# Patient Record
Sex: Female | Born: 1985
Health system: Southern US, Community
[De-identification: ages and names within clinical notes are randomized; demographics above are authoritative.]

## PROBLEM LIST (undated history)

## (undated) DIAGNOSIS — A549 Gonococcal infection, unspecified: Secondary | ICD-10-CM

## (undated) DIAGNOSIS — T7840XA Allergy, unspecified, initial encounter: Secondary | ICD-10-CM

## (undated) HISTORY — PX: APPENDECTOMY: SHX54

## (undated) HISTORY — PX: OTHER SURGICAL HISTORY: SHX169

## (undated) HISTORY — DX: Gonococcal infection, unspecified: A54.9

## (undated) HISTORY — DX: Allergy, unspecified, initial encounter: T78.40XA

## (undated) HISTORY — PX: WISDOM TOOTH EXTRACTION: SHX21

---

## 2008-08-20 ENCOUNTER — Encounter (INDEPENDENT_AMBULATORY_CARE_PROVIDER_SITE_OTHER): Payer: Self-pay | Admitting: General Surgery

## 2008-08-20 ENCOUNTER — Inpatient Hospital Stay (HOSPITAL_COMMUNITY): Admission: EM | Admit: 2008-08-20 | Discharge: 2008-08-21 | Payer: Self-pay | Admitting: Emergency Medicine

## 2009-08-08 IMAGING — CT CT ABD-PELV W/O CM
2 of 5 series · 14 of 42 positions shown, 19 images · non-contrast
Comparison: NONE

CLINICAL DATA: Right lower quadrant pain.  Evaluate for 
appendicitis.  

CT ABDOMEN AND PELVIS WITHOUT INTRAVENOUS OR ORAL CONTRAST
TECHNIQUE: Multiple axial images were obtained from the 
diaphragm through the pelvis.

[Series 2: wo · axial · 0.68mm/px · z∈[+1112,+1490]mm · 11 of 146 slices shown, 16 images]
[im 10/146  soft-tissue]
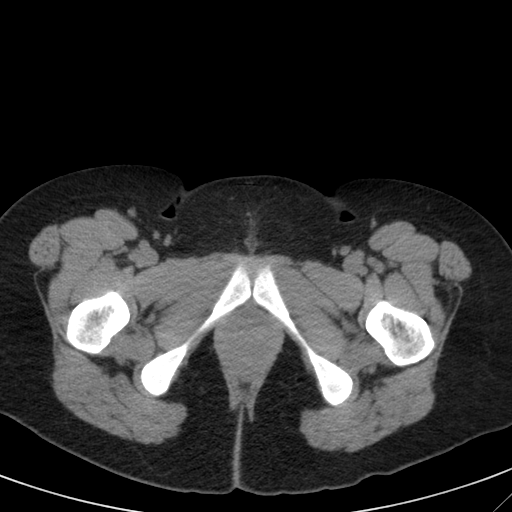
[im 10/146  bone]
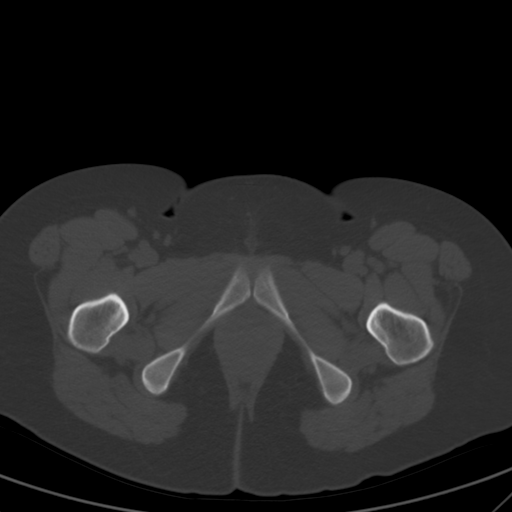
[im 28/146  soft-tissue]
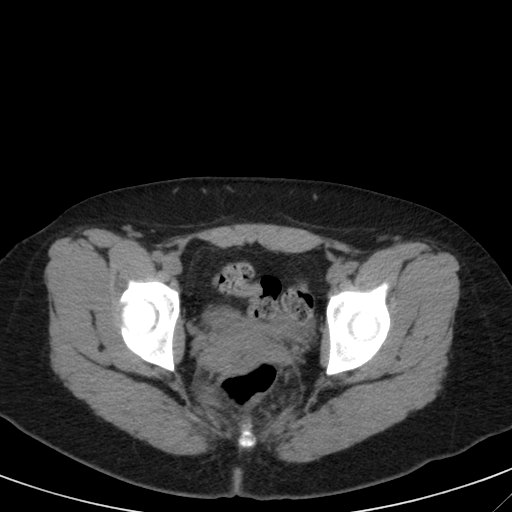
[im 37/146  soft-tissue]
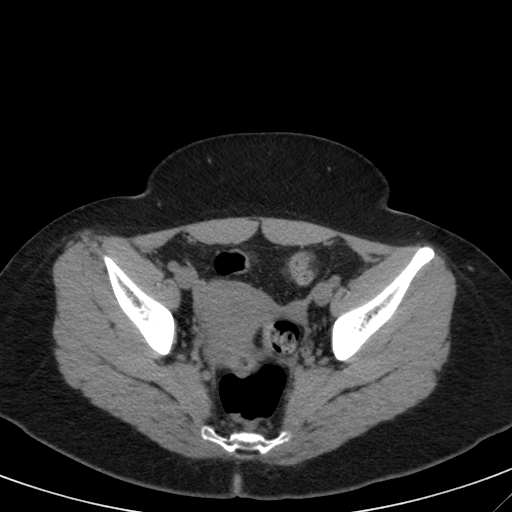
[im 55/146  soft-tissue]
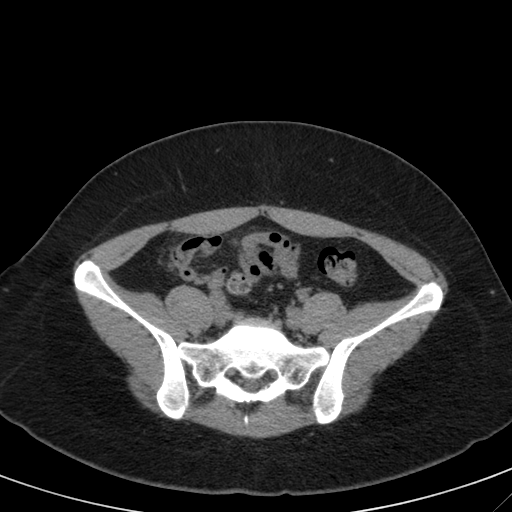
[im 64/146  soft-tissue]
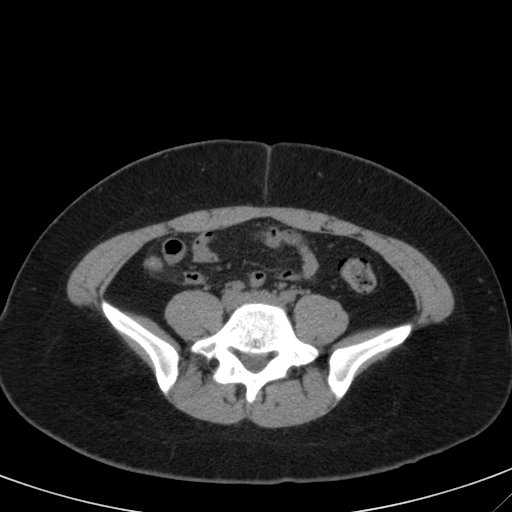
[im 82/146  soft-tissue]
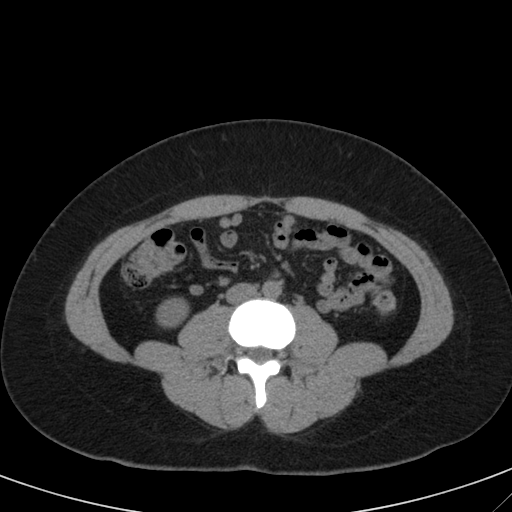
[im 91/146  soft-tissue]
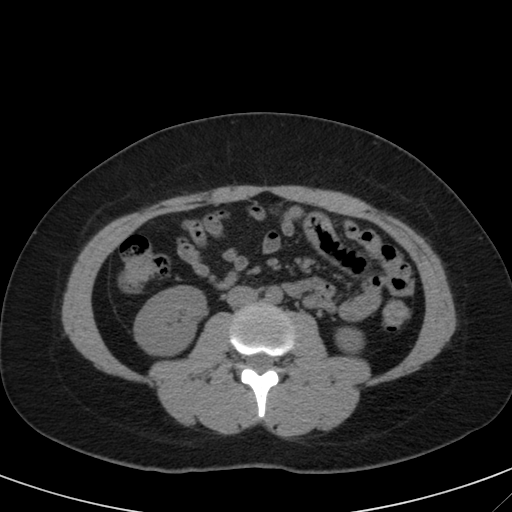
[im 109/146  soft-tissue]
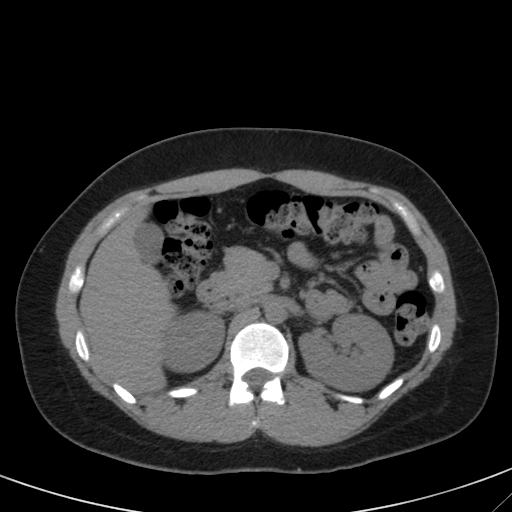
[im 109/146  lung]
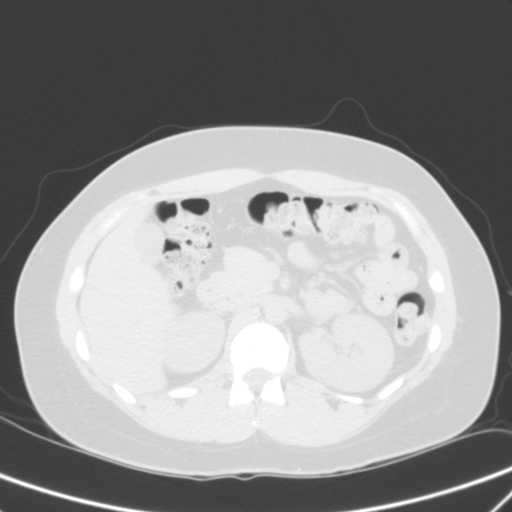
[im 118/146  soft-tissue]
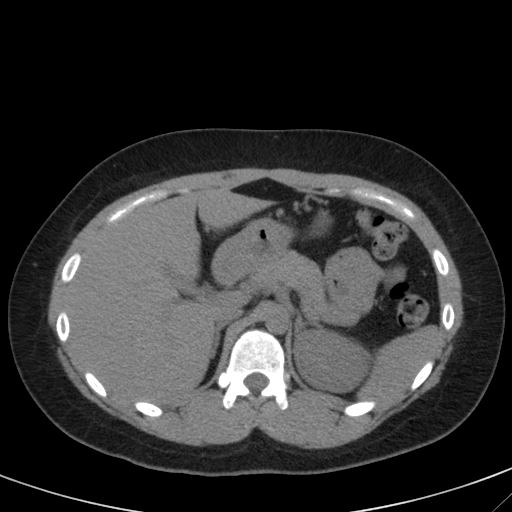
[im 118/146  lung]
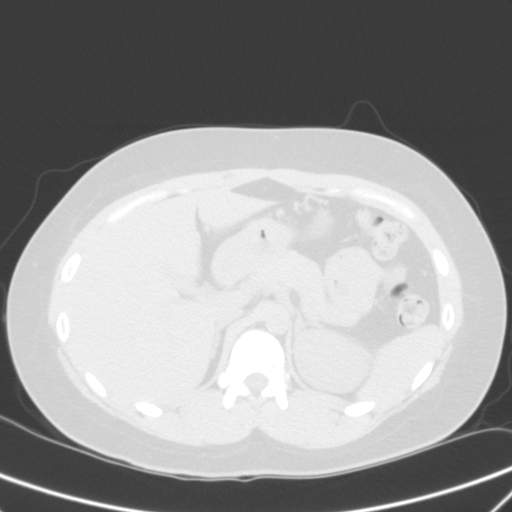
[im 118/146  bone]
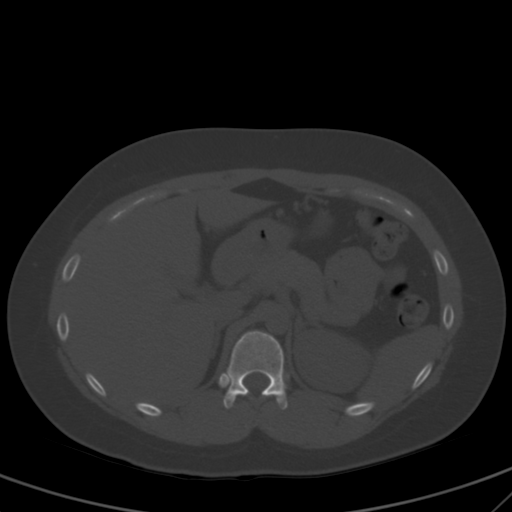
[im 127/146  lung]
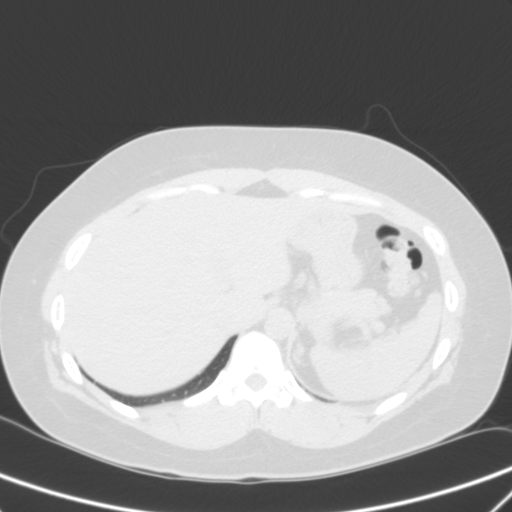
[im 136/146  soft-tissue]
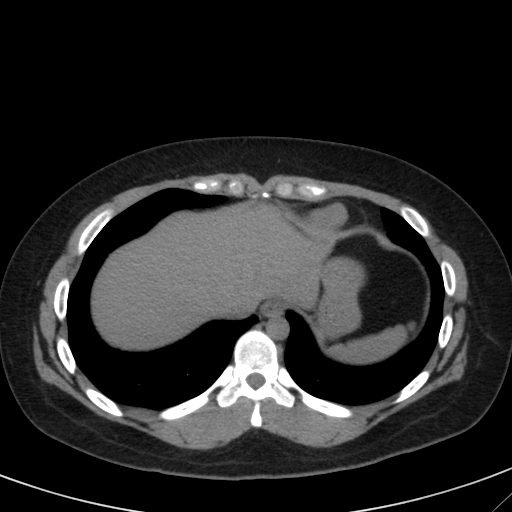
[im 136/146  lung]
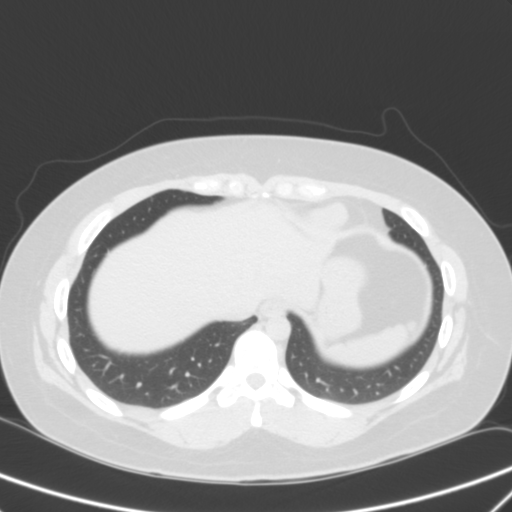

[coronals · coronal · 0.84mm/px · 3 of 62 slices shown]
[im 21/62  soft-tissue]
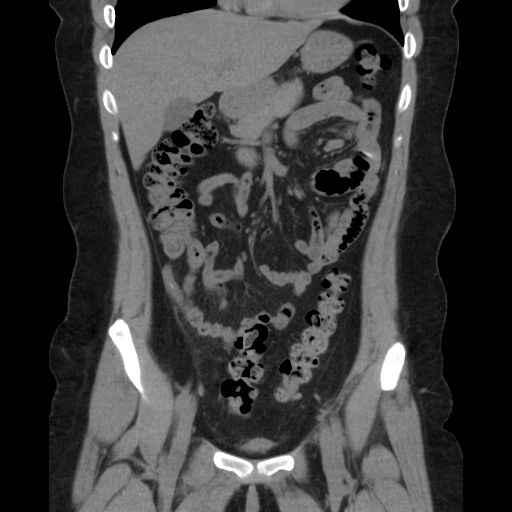
[im 28/62  soft-tissue]
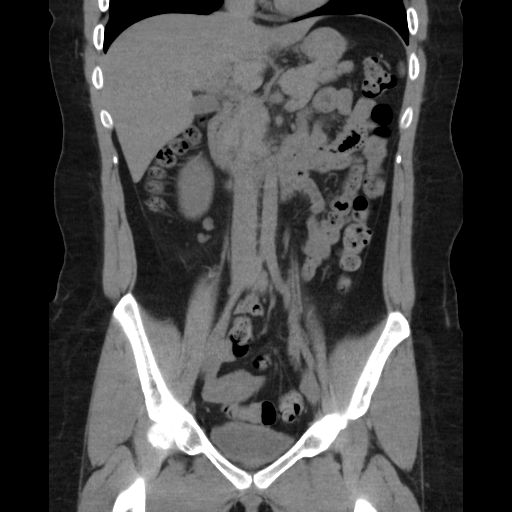
[im 34/62  soft-tissue]
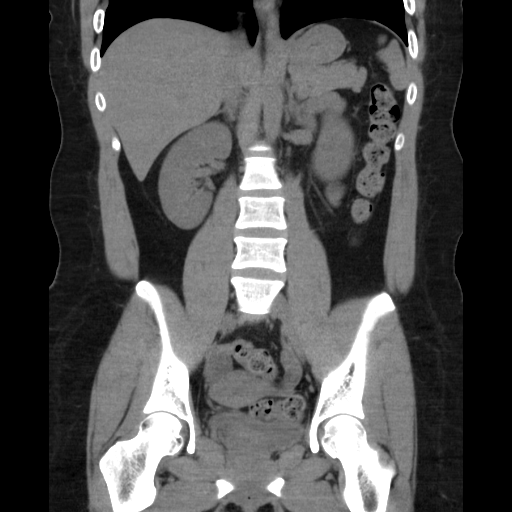

[14 of 42 positions shown; findings below may reference images not displayed]

FINDINGS: No gallstones, renal or ureteral calculi. Liver, 
pancreas, and spleen are unremarkable.  No evidence of abdominal 
aortic aneurysm. There is a tiny appendicolith in the mid portion 
of the appendix, which is dilated and inflamed.  The appendix 
measures approximately 1.2 cm in maximum diameter.  There is 
extensive periappendiceal stranding.  No free air or rupture. No 
evidence of diverticulitis, hernia, or bowel obstruction. No lung 
base mass, infiltrate, edema, or effusion. No lytic or blastic 
lesions.
IMPRESSION: Findings consistent with appendicitis. Rasmya Bsher 
08/20/2008  Tran Date:  08/20/2008 DAS  JLM

## 2010-08-29 ENCOUNTER — Emergency Department (HOSPITAL_COMMUNITY)
Admission: EM | Admit: 2010-08-29 | Discharge: 2010-08-29 | Payer: Self-pay | Source: Home / Self Care | Admitting: Emergency Medicine

## 2011-01-18 NOTE — H&P (Signed)
Tracy Cervantes, Tracy Cervantes                ACCOUNT NO.:  [id number]   MEDICAL RECORD NO.:  [id number]          PATIENT TYPE:  EMS   LOCATION:  ED                           FACILITY:  [facility]   PHYSICIAN:  Juanetta Gosling, MDDATE OF BIRTH:  01/05/1986   DATE OF ADMISSION:  08/20/2008  DATE OF DISCHARGE:                              HISTORY & PHYSICAL   CHIEF COMPLAINT:  Right lower quadrant pain.   HISTORY OF PRESENT ILLNESS:  A 25 year old female with right lower  quadrant pain since Monday that has not gone away.  It has slowly been  worsening.  She denies fevers, nausea, vomiting.  She is hungry  currently.  Has no vaginal discharge.  Last menstrual period was on  November 17 that was normal.  She has no urinary symptoms either.   PAST SURGICAL HISTORY:  Negative.   PAST MEDICAL HISTORY:  Negative.   SOCIAL HISTORY:  Occasional alcohol.  Is a nonsmoker.   ALLERGIES:  No known drug allergies.   MEDICATIONS:  No medications.   REVIEW OF SYSTEMS:  Otherwise negative.   PHYSICAL EXAMINATION:  VITAL SIGNS:  97.9, 60, 108/74, 16.  GENERAL:  She is a moderately obese female in no distress.  NECK:  Supple without adenopathy.  HEART:  Regular rate and rhythm.  LUNGS:  Clear bilaterally.  ABDOMEN:  Moderately obese.  It is soft.  There is mild tenderness in  her right lower quadrant to palpation.   Laboratory evaluation shows a white blood cell count of 8.9, hemoglobin  13.6, platelets 269, urinalysis essentially negative.  Her hCG is  negative.  She has a CT that appears to be consistent with acute  appendicitis.   IMPRESSION:  Acute appendicitis.   PLAN:  Admission, IV antibiotics, n.p.o. and appendectomy.      Juanetta Gosling, MD  Electronically Signed     MCW/MEDQ  D:  08/20/2008  T:  08/20/2008  Job:  [id number]   cc:   Pearline Cables, MD  Fax: [phone]

## 2011-01-18 NOTE — Op Note (Signed)
NAMEJUNIPER, Tracy Cervantes                ACCOUNT NO.:  [id number]   MEDICAL RECORD NO.:  [id number]          PATIENT TYPE:  INP   LOCATION:  1336                         [facility]ACILITY[facility]   PHYSICIAN:  Juanetta Gosling, MDDATE OF BIRTH:  Mar 30, 1986   DATE OF PROCEDURE:  08/20/2008  DATE OF DISCHARGE:                               OPERATIVE REPORT   POSTOPERATIVE DIAGNOSIS:  Acute appendicitis.   POSTOPERATIVE DIAGNOSIS:  Acute suppurative appendicitis.   PROCEDURE:  Laparoscopic appendectomy.   ASSISTANT:  None.   ANESTHESIA:  General.   Specimen appendix to Pathology.   ESTIMATED BLOOD LOSS:  Minimal.   DRAINS:  None.   COMPLICATIONS:  None.   DISPOSITION:  To recovery room in stable condition.   HISTORY:  [name] is a 25 year old female with approximately a 48-hour  history of right lower quadrant pain that has been worsening over that  time.  She was seen today and evaluated, and underwent a CT scan.  This  appeared to be consistent with acute appendicitis.  On exam, she has a  moderate tenderness in her right lower quadrant.  I counseled her for  laparoscopic appendectomy.   PROCEDURE:  After informed consent was obtained, the patient was  administered 1 g of Invanz, she was then taken to the operating room  where she had sequential compression devices placed on her lower  extremities.  She was then placed under general endotracheal anesthesia  without complication.  Her abdomen was then prepped and draped in a  standard, sterile, surgical fashion.  A Foley catheter was placed with  ease.  A surgical time-out was then performed.   A 10-mm vertical incision was then made below her umbilicus.  This was  carried out down to the level of her fascia, which was entered sharply.  Her peritoneum was then entered bluntly.  A 0 Vicryl pursestring suture  was then placed around the fascia.  Hasson trocar was then inserted, and  the abdomen was insufflated to 15 mmHg, which she  tolerated well.  A  further 5-mm port was placed in the suprapubic region and another was  placed in the right upper quadrant after infiltration with local  anesthetic under direct vision without complication.  The graspers were  then inserted, and the appendix was identified and noted to be acutely  suppurative.  The mesentery was thick around this as well.  The appendix  was raised, the base was identified, a [place] was used to  dissect the appendiceal mesentery.  This was then come across with a GIA  stapler at the base of the appendix without complication.  This was  hemostatic following this.  A vascular load in the stapler was then used  to come across the mesentery.  The appendix was then placed in an  EndoCatch and removed from the umbilical incision.  The abdomen was then  reinsufflated, irrigation was performed, this was clear.  All the staple  lines were noted to be hemostatic.  The 10-mm trocar was then removed.  This suture was tied down with no evidence of any  further defect.  The  abdomen was then desufflated.  All trocars were removed.  The skin was  closed with 4 Monocryl in a subcuticular fashion.  Dermabond were placed  over the wounds.  Her Foley catheter was removed.   She was extubated in the operating room, and transferred to the recovery  room in stable condition.      Juanetta Gosling, MD  Electronically Signed     MCW/MEDQ  D:  08/20/2008  T:  08/20/2008  Job:  [id number]   cc:   Pearline Cables, MD  Fax: [phone]
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
## 2011-12-30 ENCOUNTER — Ambulatory Visit (INDEPENDENT_AMBULATORY_CARE_PROVIDER_SITE_OTHER): Payer: 59 | Admitting: Family Medicine

## 2011-12-30 ENCOUNTER — Encounter: Payer: Self-pay | Admitting: Family Medicine

## 2011-12-30 VITALS — BP 111/72 | HR 83 | Temp 97.2°F | Resp 16 | Ht 62.5 in | Wt 143.8 lb

## 2011-12-30 DIAGNOSIS — B373 Candidiasis of vulva and vagina: Secondary | ICD-10-CM

## 2011-12-30 DIAGNOSIS — B3731 Acute candidiasis of vulva and vagina: Secondary | ICD-10-CM

## 2011-12-30 DIAGNOSIS — R87810 Cervical high risk human papillomavirus (HPV) DNA test positive: Secondary | ICD-10-CM

## 2011-12-30 DIAGNOSIS — Z01419 Encounter for gynecological examination (general) (routine) without abnormal findings: Secondary | ICD-10-CM

## 2011-12-30 DIAGNOSIS — Z0000 Encounter for general adult medical examination without abnormal findings: Secondary | ICD-10-CM

## 2011-12-30 DIAGNOSIS — E663 Overweight: Secondary | ICD-10-CM

## 2011-12-30 LAB — POCT WET PREP WITH KOH: Yeast Wet Prep HPF POC: NEGATIVE

## 2011-12-30 MED ORDER — FLUCONAZOLE 150 MG PO TABS: 150.0000 mg | ORAL_TABLET | Freq: Once | ORAL | Status: AC

## 2011-12-30 NOTE — Patient Instructions (Signed)
HPV Test The HPV (Human papillomavirus) test isused to screen for high-risk types with HPV infection. HPV is a group of about 100 related viruses, of which 40 types are genital viruses. Most HPV viruses cause infections that usually resolve without treatment within 2 years. Some HPV infections can cause skin and genital warts (condylomata). HPV types 16, 18, 31 and 45 are considered high-risk types of HPV. High-risk types of HPV do not usually cause visible warts, but if untreated, may lead to cancers of the outlet of the womb (cervix) or anus. An HPV test identifies the DNA (genetic) strands of the HPV infection. Because the test identifies the DNA strands, the test is also referred to as the HPV DNA test. Although HPV is found in both males and females, the HPV test is only used to screen for cervical cancer in females. This test is recommended for females:  With an abnormal Pap test.   After treatment of an abnormal Pap test.   Aged 27 and older.   After treatment of a high-risk HPV infection.  The HPV test may be done at the same time as a Pap test in females over the age of 27. Both the HPV and Pap test require a sample of cells from the cervix. PREPARATION FOR TEST  You may be asked to avoid douching, tampons or birth canal (vaginal) medicines for 48 hours before the HPV test. You will be asked to urinate before the test. For the HPV test, you will need to lie on an exam table with your feet in stirrups. A spatula will be inserted into the vagina. The spatula will be used to swab the cervix for a cell and mucus sample. The sample will be further evaluated in a lab under a microscope. NORMAL FINDINGS  Normal: High-risk HPV is not found.  Ranges for normal findings may vary among different laboratories and hospitals. You should always check with your doctor after having lab work or other tests done to discuss the meaning of your test results and whether your values are considered within normal  limits. MEANING OF TEST An abnormal HPV test means that high-risk HPV is found. Your caregiver may recommend further testing. Your caregiver will go over the test results with you and discuss the importance and meaning of your results, as well as treatment options and the need for additional tests, if necessary. OBTAINING THE RESULTS  It is your responsibility to obtain your test results. Ask the lab or department performing the test when and how you will get your results. Document Released: 09/16/2004 Document Revised: 08/11/2011 Document Reviewed: 06/01/2005 Exit[employer] Patient Information 2012 [employer]     .Keeping You Healthy  Get These Tests 1. Blood Pressure- Have your blood pressure checked once a year by your health care provider.  Normal blood pressure is 120/80. 2. Weight- Have your body mass index (BMI) calculated to screen for obesity.  BMI is measure of body fat based on height and weight.  You can also calculate your own BMI at www.nhlbisupport.com/bmi/. 3. Cholesterol- Have your cholesterol checked every 5 years starting at age 27 then yearly starting at age 27. 4. Chlamydia, HIV, and other sexually transmitted diseases- Get screened every year until age 27, then within three months of each new sexual provider. 5. Pap Smear- Every 1-3 years; discuss with your health care provider. 6. Mammogram- Every year starting at age 27  Take these medicines  Calcium with Vitamin D-Your body needs 1200 mg of Calcium each   day and 800-1000 IU of Vitamin D daily.  Your body can only absorb 500 mg of Calcium at a time so Calcium must be taken in 2 or 3 divided doses throughout the day.  Multivitamin with folic acid- Once daily if it is possible for you to become pregnant.  Get these Immunizations  Gardasil-Series of three doses; prevents HPV related illness such as genital warts and cervical cancer.  Menactra-Single dose; prevents meningitis.  Tetanus shot- Every 10 years.  Flu  shot-Every year.  Take these steps 1. Do not smoke-Your healthcare provider can help you quit.  For tips on how to quit go to www.smokefree.gov or call 1-800 QUITNOW. 2. Be physically active- Exercise 5 days a week for at least 30 minutes.  If you are not already physically active, start slow and gradually work up to 30 minutes of moderate physical activity.  Examples of moderate activity include walking briskly, dancing, swimming, bicycling, etc. 3. Breast Cancer- A self breast exam every month is important for early detection of breast cancer.  For more information and instruction on self breast exams, ask your healthcare provider or www.womenshealth.gov/faq/breast-self-exam.cfm. 4. Eat a healthy diet- Eat a variety of healthy foods such as fruits, vegetables, whole grains, low fat milk, low fat cheeses, yogurt, lean meats, poultry and fish, beans, nuts, tofu, etc.  For more information go to www. Thenutritionsource.org 5. Drink alcohol in moderation- Limit alcohol intake to one drink or less per day. Never drink and drive. 6. Depression- Your emotional health is as important as your physical health.  If you're feeling down or losing interest in things you normally enjoy please talk to your healthcare provider about being screened for depression. 7. Dental visit- Brush and floss your teeth twice daily; visit your dentist twice a year. 8. Eye doctor- Get an eye exam at least every 2 years. 9. Helmet use- Always wear a helmet when riding a bicycle, motorcycle, rollerblading or skateboarding. 10. Safe sex- If you may be exposed to sexually transmitted infections, use a condom. 11. Seat belts- Seat belts can save your live; always wear one. 12. Smoke/Carbon Monoxide detectors- These detectors need to be installed on the appropriate level of your home. Replace batteries at least once a year. 13. Skin cancer- When out in the sun please cover up and use sunscreen 15 SPF or higher. 14. Violence- If anyone  is threatening or hurting you, please tell your healthcare provider.        

## 2012-01-01 ENCOUNTER — Encounter: Payer: Self-pay | Admitting: Family Medicine

## 2012-01-01 DIAGNOSIS — E663 Overweight: Secondary | ICD-10-CM | POA: Insufficient documentation

## 2012-01-01 DIAGNOSIS — Z0000 Encounter for general adult medical examination without abnormal findings: Secondary | ICD-10-CM | POA: Insufficient documentation

## 2012-01-01 DIAGNOSIS — R87810 Cervical high risk human papillomavirus (HPV) DNA test positive: Secondary | ICD-10-CM | POA: Insufficient documentation

## 2012-01-01 NOTE — Progress Notes (Signed)
  Subjective:    Patient ID: Tracy Cervantes, female    DOB: 01-27-1986, 26 y.o.   MRN: [id number]  HPI This 26 y.o. Healthy female is here for CPE/PAP. She has no chronic medical issues except being overweight  and takes no chronic meds or supplements. She is single with no children, is a nonsmoker and rarely consumes  alcohol.Menses ar e irreg, lasting 5 days; monogamous, no birthcontrol. She exercises regularly (running 5 days/week).  She works as a Ambulance person.   PAP: 2011 (abnormal- Colposcopy with Biopsy showed no significant abnormalities)   See scanned records/media for Immunization record.    Review of Systems  Constitutional: Negative.   HENT: Negative.   Eyes: Negative.   Respiratory: Negative.   Cardiovascular: Negative.   Gastrointestinal: Negative.   Genitourinary: Negative.   Musculoskeletal: Negative.   Skin: Negative.   Neurological: Negative.   Hematological: Negative.   Psychiatric/Behavioral: Negative.        Objective:   Physical Exam  Nursing note and vitals reviewed. Constitutional: She is oriented to person, place, and time. She appears well-developed and well-nourished. No distress.  HENT:  Head: Normocephalic and atraumatic.  Right Ear: External ear normal.  Left Ear: External ear normal.  Nose: Nose normal.  Mouth/Throat: Oropharynx is clear and moist.  Eyes: Conjunctivae and EOM are normal. Pupils are equal, round, and reactive to light. No scleral icterus.  Neck: Normal range of motion. Neck supple. No thyromegaly present.  Cardiovascular: Normal rate, regular rhythm, normal heart sounds and intact distal pulses.  Exam reveals no gallop.   No murmur heard. Pulmonary/Chest: Effort normal and breath sounds normal. No respiratory distress. Right breast exhibits no inverted nipple, no mass, no nipple discharge and no tenderness. Left breast exhibits no inverted nipple, no mass, no nipple discharge and no tenderness.  Abdominal: Soft. Bowel sounds  are normal. There is no tenderness. There is no guarding.  Genitourinary: Rectum normal and uterus normal. Rectal exam shows no external hemorrhoid. There is no rash, tenderness or lesion on the right labia. There is no rash, tenderness or lesion on the left labia. Cervix exhibits discharge. Cervix exhibits no motion tenderness and no friability. Right adnexum displays no mass, no tenderness and no fullness. Left adnexum displays no mass, no tenderness and no fullness. No erythema, tenderness or bleeding around the vagina. Vaginal discharge found.  Musculoskeletal: Normal range of motion. She exhibits no edema and no tenderness.  Lymphadenopathy:    She has no cervical adenopathy.       Right: No inguinal adenopathy present.       Left: No inguinal adenopathy present.  Neurological: She is alert and oriented to person, place, and time. She has normal reflexes. No cranial nerve deficit. Coordination normal.  Skin: Skin is warm and dry. No pallor.  Psychiatric: She has a normal mood and affect. Her behavior is normal. Judgment and thought content normal.          Assessment & Plan:   1. Routine gynecological examination  POCT Wet Prep with KOH, Pap IG, CT/NG NAA, and HPV (high risk)  2. Routine general medical examination at a health care facility    3. Overweight (BMI 25.0-29.9)  Advised small changes in diet and continue regular exercise for 10-15 lbs. Wt loss  4. Candidal vulvovaginitis  RX: Diflucan 150 mg   1 tab as directed

## 2012-01-02 ENCOUNTER — Telehealth: Payer: Self-pay

## 2012-01-02 NOTE — Telephone Encounter (Signed)
PT HAD LAB WORK DONE AND WOULD LIKE TO KNOW RESULTS PLEASE CALL [phone]

## 2012-01-02 NOTE — Telephone Encounter (Signed)
Spoke with patient and let her know that labs are not back yet and we will call as soon as they get back.

## 2012-01-04 LAB — PAP IG, CT-NG NAA, HPV HIGH-RISK

## 2012-01-04 MED ORDER — AZITHROMYCIN 1 G PO PACK: 1.0000 | PACK | Freq: Once | ORAL | Status: AC

## 2012-01-04 NOTE — Progress Notes (Signed)
Quick Note:  Please call pt and advise that the following labs are abnormal...  The Chlamydia test done at the time of your PAP is positive: Medication routed to pharmacy- Zithromax 1 gram packet (add water as directed and take this 1x dose).  Schedule RTC in 1 month to test for clearance of infection. ______

## 2012-01-04 NOTE — Progress Notes (Signed)
Addended by: Dow Adolph B on: 01/04/2012 11:55 AM   Modules accepted: Orders

## 2012-01-04 NOTE — Progress Notes (Signed)
Quick Note:  Notify pt of Normal results. ______ 

## 2012-02-16 ENCOUNTER — Telehealth: Payer: Self-pay

## 2012-02-16 ENCOUNTER — Ambulatory Visit (INDEPENDENT_AMBULATORY_CARE_PROVIDER_SITE_OTHER): Payer: 59 | Admitting: Family Medicine

## 2012-02-16 ENCOUNTER — Encounter: Payer: Self-pay | Admitting: Family Medicine

## 2012-02-16 VITALS — BP 122/82 | HR 63 | Temp 97.6°F | Resp 16 | Ht 62.0 in | Wt 183.2 lb

## 2012-02-16 DIAGNOSIS — IMO0001 Reserved for inherently not codable concepts without codable children: Secondary | ICD-10-CM

## 2012-02-16 DIAGNOSIS — Z113 Encounter for screening for infections with a predominantly sexual mode of transmission: Secondary | ICD-10-CM

## 2012-02-16 DIAGNOSIS — Z309 Encounter for contraceptive management, unspecified: Secondary | ICD-10-CM

## 2012-02-16 DIAGNOSIS — N926 Irregular menstruation, unspecified: Secondary | ICD-10-CM

## 2012-02-16 MED ORDER — NORGESTIM-ETH ESTRAD TRIPHASIC 0.18/0.215/0.25 MG-25 MCG PO TABS: 1.0000 | ORAL_TABLET | Freq: Every day | ORAL | Status: DC

## 2012-02-16 NOTE — Telephone Encounter (Signed)
Dr Audria Nine  Pt said that the rx you wrote for bcp is not covered by her ins.  She would like to use a generic and could [facility] send it [facility]  thanks

## 2012-02-16 NOTE — Patient Instructions (Signed)
Contraception Choices Contraception (birth control) is the use of any methods or devices to prevent pregnancy. Below are some methods to help avoid pregnancy. HORMONAL METHODS   Contraceptive implant. This is a thin, plastic tube containing progesterone hormone. It does not contain estrogen hormone. Your caregiver inserts the tube in the inner part of the upper arm. The tube can remain in place for up to 3 years. After 3 years, the implant must be removed. The implant prevents the ovaries from releasing an egg (ovulation), thickens the cervical mucus which prevents sperm from entering the uterus, and thins the lining of the inside of the uterus.   Progesterone-only injections. These injections are given every 3 months by your caregiver to prevent pregnancy. This synthetic progesterone hormone stops the ovaries from releasing eggs. It also thickens cervical mucus and changes the uterine lining. This makes it harder for sperm to survive in the uterus.   Birth control pills. These pills contain estrogen and progesterone hormone. They work by stopping the egg from forming in the ovary (ovulation). Birth control pills are prescribed by a caregiver.Birth control pills can also be used to treat heavy periods.   Minipill. This type of birth control pill contains only the progesterone hormone. They are taken every day of each month and must be prescribed by your caregiver.   Birth control patch. The patch contains hormones similar to those in birth control pills. It must be changed once a week and is prescribed by a caregiver.   Vaginal ring. The ring contains hormones similar to those in birth control pills. It is left in the vagina for 3 weeks, removed for 1 week, and then a new one is put back in place. The patient must be comfortable inserting and removing the ring from the vagina.A caregiver's prescription is necessary.   Emergency contraception. Emergency contraceptives prevent pregnancy after  unprotected sexual intercourse. This pill can be taken right after sex or up to 5 days after unprotected sex. It is most effective the sooner you take the pills after having sexual intercourse. Emergency contraceptive pills are available without a prescription. Check with your pharmacist. Do not use emergency contraception as your only form of birth control.  BARRIER METHODS   Female condom. This is a thin sheath (latex or rubber) that is worn over the penis during sexual intercourse. It can be used with spermicide to increase effectiveness.   Female condom. This is a soft, loose-fitting sheath that is put into the vagina before sexual intercourse.   Diaphragm. This is a soft, latex, dome-shaped barrier that must be fitted by a caregiver. It is inserted into the vagina, along with a spermicidal jelly. It is inserted before intercourse. The diaphragm should be left in the vagina for 6 to 8 hours after intercourse.   Cervical cap. This is a round, soft, latex or plastic cup that fits over the cervix and must be fitted by a caregiver. The cap can be left in place for up to 48 hours after intercourse.   Sponge. This is a soft, circular piece of polyurethane foam. The sponge has spermicide in it. It is inserted into the vagina after wetting it and before sexual intercourse.   Spermicides. These are chemicals that kill or block sperm from entering the cervix and uterus. They come in the form of creams, jellies, suppositories, foam, or tablets. They do not require a prescription. They are inserted into the vagina with an applicator before having sexual intercourse. The process must be   repeated every time you have sexual intercourse.  INTRAUTERINE CONTRACEPTION  Intrauterine device (IUD). This is a T-shaped device that is put in a woman's uterus during a menstrual period to prevent pregnancy. There are 2 types:   Copper IUD. This type of IUD is wrapped in copper wire and is placed inside the uterus. Copper  makes the uterus and fallopian tubes produce a fluid that kills sperm. It can stay in place for 10 years.   Hormone IUD. This type of IUD contains the hormone progestin (synthetic progesterone). The hormone thickens the cervical mucus and prevents sperm from entering the uterus, and it also thins the uterine lining to prevent implantation of a fertilized egg. The hormone can weaken or kill the sperm that get into the uterus. It can stay in place for 5 years.  PERMANENT METHODS OF CONTRACEPTION  Female tubal ligation. This is when the woman's fallopian tubes are surgically sealed, tied, or blocked to prevent the egg from traveling to the uterus.   Female sterilization. This is when the female has the tubes that carry sperm tied off (vasectomy).This blocks sperm from entering the vagina during sexual intercourse. After the procedure, the man can still ejaculate fluid (semen).  NATURAL PLANNING METHODS  Natural family planning. This is not having sexual intercourse or using a barrier method (condom, diaphragm, cervical cap) on days the woman could become pregnant.   Calendar method. This is keeping track of the length of each menstrual cycle and identifying when you are fertile.   Ovulation method. This is avoiding sexual intercourse during ovulation.   Symptothermal method. This is avoiding sexual intercourse during ovulation, using a thermometer and ovulation symptoms.   Post-ovulation method. This is timing sexual intercourse after you have ovulated.  Regardless of which type or method of contraception you choose, it is important that you use condoms to protect against the transmission of sexually transmitted diseases (STDs). Talk with your caregiver about which form of contraception is most appropriate for you. Document Released: 08/22/2005 Document Revised: 08/11/2011 Document Reviewed: 12/29/2010 [employer] Patient Information 2012 [employer]. 

## 2012-02-16 NOTE — Progress Notes (Signed)
  Subjective:    Patient ID: Tracy Cervantes, female    DOB: [birth year], 26 y.o.   MRN: [id number]  HPI Pt returns for "test of cure" after completing treatment for Chlamydia cervicitis. She has no pelvic pain or  discharge. Had protected sex since last visit. Desires contraception-OCPs. Took OCPs several years ago  without problems. No hx of HA disorder or clotting problems. Is a nonsmoker. Menstrual hx: always irregular.  LNMP: 01/10/12.     Review of Systems As per HPI     Objective:   Physical Exam  Vitals reviewed. Constitutional: She is oriented to person, place, and time. She appears well-developed and well-nourished. No distress.  HENT:  Head: Normocephalic and atraumatic.  Right Ear: External ear normal.  Left Ear: External ear normal.  Eyes: Conjunctivae and EOM are normal. No scleral icterus.  Neck: Neck supple. No thyromegaly present.  Cardiovascular: Normal rate.   Pulmonary/Chest: Effort normal. No respiratory distress.  Abdominal: Soft. She exhibits no mass. There is no tenderness. There is no guarding.       No organomegaly  Musculoskeletal: Normal range of motion. She exhibits no edema and no tenderness.  Neurological: She is alert and oriented to person, place, and time. No cranial nerve deficit.  Skin: Skin is warm and dry. No rash noted.  Psychiatric: She has a normal mood and affect. Her behavior is normal.          Assessment & Plan:   1. Screening for STD (sexually transmitted disease)  Chlamydia probe amplification, urine  2. Contraception  Norgestimate-Ethinyl Estradiol Triphasic (ORTHO TRI-CYCLEN LO) 0.18/0.215/0.25 MG-25 MCG tablet, DISCONTINUED: Norgestimate-Ethinyl Estradiol Triphasic (ORTHO TRI-CYCLEN LO) 0.18/0.215/0.25 MG-25 MCG tablet  3. Irregular menses  Norgestimate-Ethinyl Estradiol Triphasic (ORTHO TRI-CYCLEN LO) 0.18/0.215/0.25 MG-25 MCG tablet, DISCONTINUED: Norgestimate-Ethinyl Estradiol Triphasic (ORTHO TRI-CYCLEN LO) 0.18/0.215/0.25 MG-25  MCG tablet   RTC in 6 months for Contraception follow-up; return sooner if problems develop

## 2012-02-16 NOTE — Telephone Encounter (Signed)
10:39 - phone message routed to Dr Audria Nine concerning bcp not covered by patient's ins.  I called the [facility] pharmacy, spoke to [place] (pharmacist) I advised her of situation and she gave me name of bcp (Tri-Cyclen) w/o the Lo. The RX will be free for this brand, I also told the doctor.  I called and advised patient, per Dr Audria Nine

## 2012-02-17 LAB — CHLAMYDIA PROBE AMPLIFICATION, URINE: Chlamydia, Swab/Urine, PCR: NEGATIVE

## 2012-02-17 NOTE — Telephone Encounter (Signed)
This was discussed with Ms. Alona Bene, CMA and I authorized OCP change to free contraceptive pill.

## 2012-02-19 NOTE — Progress Notes (Signed)
Quick Note:  Please notify pt that results are normal.   Provide pt with copy of labs. ______ 

## 2012-03-02 ENCOUNTER — Telehealth: Payer: Self-pay

## 2012-03-02 NOTE — Telephone Encounter (Signed)
PT WOULD LIKE TO COME BY AND PICK UP A COPY OF HER MEDICAL RECORDS. WAS TOLD IT COULD TAKE UP TO 3-4 DAYS PLEASE CALL [phone] IF NEEDED

## 2012-03-02 NOTE — Telephone Encounter (Signed)
Records printed/copied and ready for pickup. Patient notified.

## 2012-04-06 ENCOUNTER — Encounter: Payer: Self-pay | Admitting: Family Medicine

## 2012-05-05 ENCOUNTER — Ambulatory Visit (INDEPENDENT_AMBULATORY_CARE_PROVIDER_SITE_OTHER): Payer: 59 | Admitting: Family Medicine

## 2012-05-05 VITALS — BP 133/87 | HR 71 | Temp 98.4°F | Resp 16 | Ht 62.0 in | Wt 180.4 lb

## 2012-05-05 DIAGNOSIS — R5383 Other fatigue: Secondary | ICD-10-CM

## 2012-05-05 DIAGNOSIS — R42 Dizziness and giddiness: Secondary | ICD-10-CM

## 2012-05-05 DIAGNOSIS — R5381 Other malaise: Secondary | ICD-10-CM

## 2012-05-05 LAB — POCT URINALYSIS DIPSTICK
Bilirubin, UA: NEGATIVE
Blood, UA: NEGATIVE
Glucose, UA: NEGATIVE
Leukocytes, UA: NEGATIVE
Nitrite, UA: NEGATIVE
Protein, UA: NEGATIVE
Spec Grav, UA: >=1.03
Urobilinogen, UA: 0.2
pH, UA: 6

## 2012-05-05 LAB — POCT CBC
Granulocyte percent: 71.2 % (ref 37–80)
HCT, POC: 45.3 % (ref 37.7–47.9)
Hemoglobin: 13.8 g/dL (ref 12.2–16.2)
Lymph, poc: 2.6 (ref 0.6–3.4)
MCH, POC: 28.3 pg (ref 27–31.2)
MCHC: 30.5 g/dL — AB (ref 31.8–35.4)
MCV: 92.8 fL (ref 80–97)
MID (cbc): 0.5 (ref 0–0.9)
MPV: 10.1 fL (ref 0–99.8)
POC Granulocyte: 7.7 — AB (ref 2–6.9)
POC LYMPH PERCENT: 24.3 % (ref 10–50)
POC MID %: 4.5 % (ref 0–12)
Platelet Count, POC: 268 K/uL (ref 142–424)
RBC: 4.88 M/uL (ref 4.04–5.48)
RDW, POC: 12.8 %
WBC: 10.8 K/uL — AB (ref 4.6–10.2)

## 2012-05-05 LAB — POCT UA - MICROSCOPIC ONLY
Casts, Ur, LPF, POC: NEGATIVE
Crystals, Ur, HPF, POC: NEGATIVE
Mucus, UA: POSITIVE
Yeast, UA: NEGATIVE

## 2012-05-05 LAB — POCT SEDIMENTATION RATE: POCT SED RATE: 44 mm/hr — AB (ref 0–22)

## 2012-05-05 NOTE — Progress Notes (Signed)
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  She has had two weeks of progressive weakness and lightheadedness, with dizziness worse today. On birth control for past month, LMP August 27th,  She is on a lower dose from her Ob-Gyn.  (With her first OCP she had a bad headache)  F/H: neg for DM  Sig Negs:  Trauma, headache, nausea or vomiting.    Obj:  Overweight young adult in NAD, appropriate with good eye contact HEENT:  Unremarkable Chest:  Clear Heart:  Regular without murmur Abdomen: soft, nontender without HSM Skin:  No rashes seen  Assessment:

## 2012-05-06 LAB — COMPREHENSIVE METABOLIC PANEL
ALT: 34 U/L (ref 0–35)
AST: 23 U/L (ref 0–37)
Albumin: 4.6 g/dL (ref 3.5–5.2)
Alkaline Phosphatase: 54 U/L (ref 39–117)
BUN: 15 mg/dL (ref 6–23)
CO2: 28 mEq/L (ref 19–32)
Calcium: 9.5 mg/dL (ref 8.4–10.5)
Chloride: 102 mEq/L (ref 96–112)
Creat: 0.73 mg/dL (ref 0.50–1.10)
Glucose, Bld: 84 mg/dL (ref 70–99)
Potassium: 3.9 mEq/L (ref 3.5–5.3)
Sodium: 139 mEq/L (ref 135–145)
Total Bilirubin: 0.3 mg/dL (ref 0.3–1.2)
Total Protein: 7.3 g/dL (ref 6.0–8.3)

## 2012-05-08 LAB — EPSTEIN-BARR VIRUS VCA ANTIBODY PANEL
EBV EA IgG: <5 U/mL (ref <9.0)
EBV NA IgG: 292 U/mL — ABNORMAL HIGH (ref <18.0)
EBV VCA IgG: 626 U/mL — ABNORMAL HIGH (ref <18.0)
EBV VCA IgM: <10 U/mL (ref <36.0)

## 2012-07-09 ENCOUNTER — Ambulatory Visit (INDEPENDENT_AMBULATORY_CARE_PROVIDER_SITE_OTHER): Payer: 59 | Admitting: Family Medicine

## 2012-07-09 ENCOUNTER — Encounter: Payer: Self-pay | Admitting: *Deleted

## 2012-07-09 VITALS — BP 134/90 | HR 88 | Temp 98.5°F | Resp 16 | Ht 61.0 in | Wt 178.4 lb

## 2012-07-09 DIAGNOSIS — H6690 Otitis media, unspecified, unspecified ear: Secondary | ICD-10-CM

## 2012-07-09 MED ORDER — AMOXICILLIN-POT CLAVULANATE 875-125 MG PO TABS: 1.0000 | ORAL_TABLET | Freq: Two times a day (BID) | ORAL | Status: DC

## 2012-07-09 NOTE — Patient Instructions (Addendum)
You have an ear infection.  Use the antibiotic pill as directed and OTC medications for pain as needed.  If you are not better in the next couple of days let me know.   Otitis Media You or your child has otitis media. This is an infection of the middle chamber of the ear. This condition is common in young children and often follows upper respiratory infections. Symptoms of otitis media may include earache or ear fullness, hearing loss, or fever. If the eardrum ruptures, a middle ear infection may also cause bloody or pus-like discharge from the ear. Fussiness, irritability, and persistent crying may be the only signs of otitis media in small children. Otitis media can be caused by a bacteria or a virus. Antibiotics may be used to treat bacterial otitis media. But antibiotics are not effective against viral infections. Not every case of bacterial otitis media requires antibiotics and depending on age, severity of infection, and other risk factors, observation may be all that is required. Ear drops or oral medicines may be prescribed to reduce pain, fever, or congestion. Babies with ear infections should not be fed while lying on their backs. This increases the pressure and pain in the ear. Do not put cotton in the ear canal or clean it with cotton swabs. Swimming should be avoided if the eardrum has ruptured or if there is drainage from the ear canal. If your child experiences recurrent infections, your child may need to be referred to an Ear, Nose, and Throat specialist. HOME CARE INSTRUCTIONS   Take any antibiotic as directed by your caregiver. You or your child may feel better in a few days, but take all medicine or the infection may not respond and may become more difficult to treat.   Only take over-the-counter or prescription medicines for pain, discomfort, or fever as directed by your caregiver. Do not give aspirin to children.  Otitis media can lead to complications including rupture of the eardrum,  long-term hearing loss, and more severe infections. Call your caregiver for follow-up care at the end of treatment. SEEK IMMEDIATE MEDICAL CARE IF:   Your or your child's problems do not improve within 2 to 3 days.   You or your child has an oral temperature above 102 F (38.9 C), not controlled by medicine.   Your baby is older than 3 months with a rectal temperature of 102 F (38.9 C) or higher.   Your baby is 97 months old or younger with a rectal temperature of 100.4 F (38 C) or higher.   Your child develops increased fussiness.   You or your child develops a stiff neck, severe headache, or confusion.   There is swelling around the ear.   There is dizziness, vomiting, unusual sleepiness, seizures, or twitching of facial muscles.   The pain or ear drainage persists beyond 2 days of antibiotic treatment.  Document Released: 09/29/2004 Document Revised: 08/11/2011 Document Reviewed: 12/18/2009 [facility] Patient Information 2012 [place], [place].

## 2012-07-09 NOTE — Progress Notes (Signed)
[facility] [address] [place] [address] [phone]  Date:  07/09/2012   Name:  Tracy Cervantes   DOB:  October 05, 1985   MRN:  [id number]  PCP:  No primary provider on file.    Chief Complaint: Otalgia   History of Present Illness:  Tracy Cervantes is a 26 y.o. very pleasant female patient who presents with the following:  She is here with left ear pain that started around 12:30 today- seen around 3:30 pm.  Her right ear feels ok.  She also notes a ST, cough for the last day or so but her ear is the main problem. She does not feel achy or fatigued.  She has not noted a fever.  LMP was 06/26/12 She is otherwise generally healthy.  No tobacco    Patient Active Problem List  Diagnosis  . Health care maintenance  . Overweight (BMI 25.0-29.9)  . Cervical high risk HPV (human papillomavirus) test positive    No past medical history on file.  No past surgical history on file.  History  Substance Use Topics  . Smoking status: Never Smoker   . Smokeless tobacco: Not on file  . Alcohol Use: Not on file    No family history on file.  No Known Allergies  Medication list has been reviewed and updated.  Current Outpatient Prescriptions on File Prior to Visit  Medication Sig Dispense Refill  . Norgestimate-Ethinyl Estradiol Triphasic (ORTHO TRI-CYCLEN LO) 0.18/0.215/0.25 MG-25 MCG tablet Take 1 tablet by mouth daily.  1 Package  11    Review of Systems:  As per HPI- otherwise negative.   Physical Examination: Filed Vitals:   07/09/12 1523  BP: 134/90  Pulse: 88  Temp: 98.5 F (36.9 C)  Resp: 16   Filed Vitals:   07/09/12 1523  Height: 5\' 1"  (1.549 m)  Weight: 178 lb 6.4 oz (80.922 kg)   Body mass index is 33.71 kg/(m^2). Ideal Body Weight: Weight in (lb) to have BMI = 25: 132   GEN: WDWN, NAD, Non-toxic, A & O x 3, overweight HEENT: Atraumatic, Normocephalic. Neck supple. No masses, No LAD.  Left TM is red, bulging with acute OM.  Left  TM wnl, oropharynx normal.  PEERL,EOMI.   Ears and Nose: No external deformity. CV: RRR, No M/G/R. No JVD. No thrill. No extra heart sounds. PULM: CTA B, no wheezes, crackles, rhonchi. No retractions. No resp. distress. No accessory muscle use. EXTR: No c/c/e NEURO Normal gait.  PSYCH: Normally interactive. Conversant. Not depressed or anxious appearing.  Calm demeanor.    Assessment and Plan: 1. Otitis media  amoxicillin-clavulanate (AUGMENTIN) 875-125 MG per tablet   Will treat with augmentin for AOM.  See pt instructions for further details.  Went over signs and symptoms of TM rupture- in this case please come in for a recheck.   Meds ordered this encounter  Medications  . amoxicillin-clavulanate (AUGMENTIN) 875-125 MG per tablet    Sig: Take 1 tablet by mouth 2 (two) times daily.    Dispense:  20 tablet    Refill:  0     COPLAND,JESSICA, MD

## 2012-10-24 ENCOUNTER — Ambulatory Visit (INDEPENDENT_AMBULATORY_CARE_PROVIDER_SITE_OTHER): Payer: 59 | Admitting: Family Medicine

## 2012-10-24 ENCOUNTER — Telehealth: Payer: Self-pay | Admitting: Family Medicine

## 2012-10-24 VITALS — BP 131/89 | HR 71 | Temp 98.1°F | Resp 16 | Ht 63.0 in | Wt 180.0 lb

## 2012-10-24 DIAGNOSIS — N926 Irregular menstruation, unspecified: Secondary | ICD-10-CM

## 2012-10-24 DIAGNOSIS — R5381 Other malaise: Secondary | ICD-10-CM

## 2012-10-24 DIAGNOSIS — Z113 Encounter for screening for infections with a predominantly sexual mode of transmission: Secondary | ICD-10-CM

## 2012-10-24 DIAGNOSIS — Z309 Encounter for contraceptive management, unspecified: Secondary | ICD-10-CM

## 2012-10-24 DIAGNOSIS — IMO0001 Reserved for inherently not codable concepts without codable children: Secondary | ICD-10-CM

## 2012-10-24 DIAGNOSIS — B9689 Other specified bacterial agents as the cause of diseases classified elsewhere: Secondary | ICD-10-CM

## 2012-10-24 DIAGNOSIS — Z0000 Encounter for general adult medical examination without abnormal findings: Secondary | ICD-10-CM

## 2012-10-24 DIAGNOSIS — R5383 Other fatigue: Secondary | ICD-10-CM

## 2012-10-24 DIAGNOSIS — N898 Other specified noninflammatory disorders of vagina: Secondary | ICD-10-CM

## 2012-10-24 LAB — POCT CBC
Granulocyte percent: 67.3 % (ref 37–80)
HCT, POC: 45.9 % (ref 37.7–47.9)
Hemoglobin: 14.5 g/dL (ref 12.2–16.2)
Lymph, poc: 2.5 (ref 0.6–3.4)
MCH, POC: 28.7 pg (ref 27–31.2)
MCHC: 31.6 g/dL — AB (ref 31.8–35.4)
MCV: 90.8 fL (ref 80–97)
MID (cbc): 0.5 (ref 0–0.9)
MPV: 10.1 fL (ref 0–99.8)
POC Granulocyte: 6.1 (ref 2–6.9)
POC LYMPH PERCENT: 27.3 % (ref 10–50)
POC MID %: 5.4 %M (ref 0–12)
Platelet Count, POC: 271 10*3/uL (ref 142–424)
RBC: 5.06 M/uL (ref 4.04–5.48)
RDW, POC: 12.8 %
WBC: 9.1 10*3/uL (ref 4.6–10.2)

## 2012-10-24 LAB — COMPREHENSIVE METABOLIC PANEL
ALT: 30 U/L (ref 0–35)
BUN: 15 mg/dL (ref 6–23)
Creat: 0.68 mg/dL (ref 0.50–1.10)
Glucose, Bld: 81 mg/dL (ref 70–99)
Sodium: 138 mEq/L (ref 135–145)
Total Bilirubin: 0.4 mg/dL (ref 0.3–1.2)

## 2012-10-24 LAB — TSH: TSH: 1.069 u[IU]/mL (ref 0.350–4.500)

## 2012-10-24 LAB — LIPID PANEL
Cholesterol: 251 mg/dL — ABNORMAL HIGH (ref 0–200)
HDL: 62 mg/dL (ref >39)
LDL Cholesterol: 160 mg/dL — ABNORMAL HIGH (ref 0–99)
Total CHOL/HDL Ratio: 4 ratio
Triglycerides: 146 mg/dL (ref <150)
VLDL: 29 mg/dL (ref 0–40)

## 2012-10-24 LAB — HIV ANTIBODY (ROUTINE TESTING W REFLEX): HIV: NONREACTIVE

## 2012-10-24 LAB — COMPREHENSIVE METABOLIC PANEL WITH GFR
AST: 24 U/L (ref 0–37)
Albumin: 4.2 g/dL (ref 3.5–5.2)
Alkaline Phosphatase: 51 U/L (ref 39–117)
CO2: 25 meq/L (ref 19–32)
Calcium: 9.4 mg/dL (ref 8.4–10.5)
Chloride: 104 meq/L (ref 96–112)
Potassium: 4.1 meq/L (ref 3.5–5.3)
Total Protein: 7.6 g/dL (ref 6.0–8.3)

## 2012-10-24 LAB — HEPATITIS B SURFACE ANTIGEN: Hepatitis B Surface Ag: NEGATIVE

## 2012-10-24 LAB — POCT WET PREP WITH KOH
Clue Cells Wet Prep HPF POC: 100
KOH Prep POC: NEGATIVE
Trichomonas, UA: NEGATIVE
Yeast Wet Prep HPF POC: NEGATIVE

## 2012-10-24 LAB — POCT URINE PREGNANCY: Preg Test, Ur: NEGATIVE

## 2012-10-24 LAB — HEPATITIS C ANTIBODY: HCV Ab: NEGATIVE

## 2012-10-24 MED ORDER — DESOGESTREL-ETHINYL ESTRADIOL 0.15-0.02/0.01 MG (21/5) PO TABS: 1.0000 | ORAL_TABLET | Freq: Every day | ORAL | Status: DC

## 2012-10-24 MED ORDER — METRONIDAZOLE 500 MG PO TABS: 500.0000 mg | ORAL_TABLET | Freq: Two times a day (BID) | ORAL | Status: DC

## 2012-10-24 NOTE — Telephone Encounter (Signed)
LM for her to call us back. She left before wet prep was completed. Please let her know she has BActerial Vaginosis. I will rx her Flagyl 500 mg BID x 7 [facility]

## 2012-10-24 NOTE — Progress Notes (Signed)
[facility]:  Office Visit  Chief Complaint:  Chief Complaint  Patient presents with  . Annual Exam    not sure if she wants a pap     HPI: Tracy Cervantes is a 27 y.o. female who complains of here for annual exam. She has no complaints except some mild fatigue. Hair is falling  out "when she washes it, has clumsp of hair". Skin is nl. Not depressed. No weight gain or weight loss.  Menarche 27 y/o, regular since started OCP, irregular prior to this, nl -light flow with OCP.Started OCP when she was 62.  Prior h/o abnormal Pap, s/p biopsy which was negative in 2010. Subsequent paps have been normal.  Sexually  Active, heterosexual, uses condoms each time LMP 10/14/12  Past Medical History  Diagnosis Date  . Allergy    Past Surgical History  Procedure Laterality Date  . Appendectomy     History   Social History  . Marital Status: Single    Spouse Name: N/A    Number of Children: N/A  . Years of Education: N/A   Social History Main Topics  . Smoking status: Never Smoker   . Smokeless tobacco: None  . Alcohol Use: Yes  . Drug Use: No  . Sexually Active: Yes    Birth Control/ Protection: Pill   Other Topics Concern  . None   Social History Narrative  . None   History reviewed. No pertinent family history. No Known Allergies Prior to Admission medications   Medication Sig Start Date End Date Taking? Authorizing Provider  Norgestimate-Ethinyl Estradiol Triphasic (ORTHO TRI-CYCLEN LO) 0.18/0.215/0.25 MG-25 MCG tablet Take 1 tablet by mouth daily. 02/16/12 02/15/13 Yes Maurice March, MD  amoxicillin-clavulanate (AUGMENTIN) 875-125 MG per tablet Take 1 tablet by mouth 2 (two) times daily. 07/09/12   Gwenlyn Found Copland, MD     ROS: The patient denies fevers, chills, night sweats, unintentional weight loss, chest pain, palpitations, wheezing, dyspnea on exertion, nausea, vomiting, abdominal pain, dysuria, hematuria, melena, numbness, weakness, or tingling.    All other systems have been reviewed and were otherwise negative with the exception of those mentioned in the HPI and as above.    PHYSICAL EXAM: Filed Vitals:   10/24/12 1150  BP: 131/89  Pulse: 71  Temp: 98.1 F (36.7 C)  Resp: 16   Filed Vitals:   10/24/12 1150  Height: 5\' 3"  (1.6 m)  Weight: 180 lb (81.647 kg)   Body mass index is 31.89 kg/(m^2).  General: Alert, no acute distress HEENT:  Normocephalic, atraumatic, oropharynx patent. EOMI, PERRLA, fundoscopic exam normal. Cardiovascular:  Regular rate and rhythm, no rubs murmurs or gallops.  No Carotid bruits, radial pulse intact. No pedal edema.  Respiratory: Clear to auscultation bilaterally.  No wheezes, rales, or rhonchi.  No cyanosis, no use of accessory musculature GI: No organomegaly, abdomen is soft and non-tender, positive bowel sounds.  No masses. Skin: No rashes. Neurologic: Facial musculature symmetric. Psychiatric: Patient is appropriate throughout our interaction. Lymphatic: No cervical lymphadenopathy Musculoskeletal: Gait intact. Breast exam normal GU exam-+ white dc, odor; No masses, lesions, CMT    LABS: Results for orders placed in visit on 10/24/12  POCT CBC      Result Value Range   WBC 9.1  4.6 - 10.2 K/uL   Lymph, poc 2.5  0.6 - 3.4   POC LYMPH PERCENT 27.3  10 - 50 %L   MID (cbc) 0.5  0 - 0.9   POC MID %  5.4  0 - 12 %M   POC Granulocyte 6.1  2 - 6.9   Granulocyte percent 67.3  37 - 80 %G   RBC 5.06  4.04 - 5.48 M/uL   Hemoglobin 14.5  12.2 - 16.2 g/dL   HCT, POC 16.1  09.6 - 47.9 %   MCV 90.8  80 - 97 fL   MCH, POC 28.7  27 - 31.2 pg   MCHC 31.6 (*) 31.8 - 35.4 g/dL   RDW, POC 04.5     Platelet Count, POC 271  142 - 424 K/uL   MPV 10.1  0 - 99.8 fL  POCT URINE PREGNANCY      Result Value Range   Preg Test, Ur Negative    POCT WET PREP WITH KOH      Result Value Range   Trichomonas, UA Negative     Clue Cells Wet Prep HPF POC 100%     Epithelial Wet Prep HPF POC 6-15     Yeast  Wet Prep HPF POC neg     Bacteria Wet Prep HPF POC 4+     RBC Wet Prep HPF POC 1-2     WBC Wet Prep HPF POC 5-tntc     KOH Prep POC Negative       EKG/XRAY:   Primary read interpreted by Dr. Conley Rolls at [facility].   ASSESSMENT/PLAN: Encounter Diagnoses  Name Primary?  . Annual physical exam Yes  . Screening for STD (sexually transmitted disease)   . Other malaise and fatigue   . Vaginal discharge   . Contraception   . Irregular menses    Annual labs and STD tests pending Rx Falgyl for Bacterial Vaginosis OCP refilled F/u prn   Lorrine Killilea PHUONG, DO 10/24/2012 6:00 PM

## 2012-10-24 NOTE — Progress Notes (Signed)
  Subjective:    Patient ID: Tracy Cervantes, female    DOB: 18-Sep-1985, 27 y.o.   MRN: [id number]  HPI    Review of Systems  Constitutional: Positive for fatigue.       Objective:   Physical Exam        Assessment & Plan:

## 2012-10-25 LAB — HSV(HERPES SIMPLEX VRS) I + II AB-IGG
HSV 1 Glycoprotein G Ab, IgG: 7.42 IV — ABNORMAL HIGH
HSV 2 Glycoprotein G Ab, IgG: 0.18 IV

## 2012-10-25 LAB — RPR

## 2012-10-25 LAB — PAP IG, CT-NG, RFX HPV ASCU
Chlamydia Probe Amp: NEGATIVE
GC Probe Amp: NEGATIVE

## 2012-10-25 LAB — HEPATITIS B SURFACE ANTIBODY, QUANTITATIVE: Hep B S AB Quant (Post): 518.7 m[IU]/mL

## 2012-11-08 ENCOUNTER — Encounter: Payer: Self-pay | Admitting: Family Medicine

## 2013-01-04 ENCOUNTER — Ambulatory Visit (INDEPENDENT_AMBULATORY_CARE_PROVIDER_SITE_OTHER): Payer: 59 | Admitting: Family Medicine

## 2013-01-04 ENCOUNTER — Encounter: Payer: Self-pay | Admitting: Family Medicine

## 2013-01-04 VITALS — BP 127/86 | HR 84 | Temp 98.7°F | Resp 16 | Ht 62.5 in | Wt 181.4 lb

## 2013-01-04 DIAGNOSIS — N760 Acute vaginitis: Secondary | ICD-10-CM

## 2013-01-04 DIAGNOSIS — N898 Other specified noninflammatory disorders of vagina: Secondary | ICD-10-CM

## 2013-01-04 DIAGNOSIS — A499 Bacterial infection, unspecified: Secondary | ICD-10-CM

## 2013-01-04 DIAGNOSIS — B9689 Other specified bacterial agents as the cause of diseases classified elsewhere: Secondary | ICD-10-CM

## 2013-01-04 DIAGNOSIS — B373 Candidiasis of vulva and vagina: Secondary | ICD-10-CM

## 2013-01-04 DIAGNOSIS — Z3009 Encounter for other general counseling and advice on contraception: Secondary | ICD-10-CM

## 2013-01-04 LAB — POCT WET PREP WITH KOH
KOH Prep POC: POSITIVE
Trichomonas, UA: NEGATIVE
Yeast Wet Prep HPF POC: NEGATIVE

## 2013-01-04 MED ORDER — NORELGESTROMIN-ETH ESTRADIOL 150-35 MCG/24HR TD PTWK: 1.0000 | MEDICATED_PATCH | TRANSDERMAL | Status: DC

## 2013-01-04 MED ORDER — METRONIDAZOLE 500 MG PO TABS: 500.0000 mg | ORAL_TABLET | Freq: Two times a day (BID) | ORAL | Status: DC

## 2013-01-04 MED ORDER — FLUCONAZOLE 150 MG PO TABS: 150.0000 mg | ORAL_TABLET | Freq: Once | ORAL | Status: DC

## 2013-01-04 NOTE — Patient Instructions (Signed)
Consider starting to take vaginal probiotics (you take them orally). You can find them at [facility] or [facility] likely.  Also, try switching the type of condoms you are using to non-latex.  Only wash externally with Dove soap and make sure your partner does the same.  Bacterial Vaginosis Bacterial vaginosis (BV) is a vaginal infection where the normal balance of bacteria in the vagina is disrupted. The normal balance is then replaced by an overgrowth of certain bacteria. There are several different kinds of bacteria that can cause BV. BV is the most common vaginal infection in women of childbearing age. CAUSES   The cause of BV is not fully understood. BV develops when there is an increase or imbalance of harmful bacteria.  Some activities or behaviors can upset the normal balance of bacteria in the vagina and put women at increased risk including:  Having a new sex partner or multiple sex partners.  Douching.  Using an intrauterine device (IUD) for contraception.  It is not clear what role sexual activity plays in the development of BV. However, women that have never had sexual intercourse are rarely infected with BV. Women do not get BV from toilet seats, bedding, swimming pools or from touching objects around them.  SYMPTOMS   Grey vaginal discharge.  A fish-like odor with discharge, especially after sexual intercourse.  Itching or burning of the vagina and vulva.  Burning or pain with urination.  Some women have no signs or symptoms at all. DIAGNOSIS  Your caregiver must examine the vagina for signs of BV. Your caregiver will perform lab tests and look at the sample of vaginal fluid through a microscope. They will look for bacteria and abnormal cells (clue cells), a pH test higher than 4.5, and a positive amine test all associated with BV.  RISKS AND COMPLICATIONS   Pelvic inflammatory disease (PID).  Infections following gynecology surgery.  Developing HIV.  Developing herpes  virus. TREATMENT  Sometimes BV will clear up without treatment. However, all women with symptoms of BV should be treated to avoid complications, especially if gynecology surgery is planned. Female partners generally do not need to be treated. However, BV may spread between female sex partners so treatment is helpful in preventing a recurrence of BV.   BV may be treated with antibiotics. The antibiotics come in either pill or vaginal cream forms. Either can be used with nonpregnant or pregnant women, but the recommended dosages differ. These antibiotics are not harmful to the baby.  BV can recur after treatment. If this happens, a second round of antibiotics will often be prescribed.  Treatment is important for pregnant women. If not treated, BV can cause a premature delivery, especially for a pregnant woman who had a premature birth in the past. All pregnant women who have symptoms of BV should be checked and treated.  For chronic reoccurrence of BV, treatment with a type of prescribed gel vaginally twice a week is helpful. HOME CARE INSTRUCTIONS   Finish all medication as directed by your caregiver.  Do not have sex until treatment is completed.  Tell your sexual partner that you have a vaginal infection. They should see their caregiver and be treated if they have problems, such as a mild rash or itching.  Practice safe sex. Use condoms. Only have 1 sex partner. PREVENTION  Basic prevention steps can help reduce the risk of upsetting the natural balance of bacteria in the vagina and developing BV:  Do not have sexual intercourse (be abstinent).  Do not douche.  Use all of the medicine prescribed for treatment of BV, even if the signs and symptoms go away.  Tell your sex partner if you have BV. That way, they can be treated, if needed, to prevent reoccurrence. SEEK MEDICAL CARE IF:   Your symptoms are not improving after 3 days of treatment.  You have increased discharge, pain, or  fever. MAKE SURE YOU:   Understand these instructions.  Will watch your condition.  Will get help right away if you are not doing well or get worse. FOR MORE INFORMATION  Division of STD Prevention (DSTDP), Centers for Disease Control and Prevention: [employer] American Social Health Association (ASHA): www.ashastd.org  Document Released: 08/22/2005 Document Revised: 11/14/2011 Document Reviewed: 02/12/2009 [facility] Patient Information 2013 [place], [place].

## 2013-01-04 NOTE — Progress Notes (Signed)
  Subjective:    Patient ID: Tracy Cervantes, female    DOB: [birth year], 27 y.o.   MRN: [id number] Chief Complaint  Patient presents with  . Vaginal Discharge    HPI  Feels like she has constant BV.  She has seen gyn in the past.  When she finishes the medicine, the sxs come back again.  Everytime she has sex, uses condoms. Current discharge for the past 2 wks, with odor, no itching.    Review of Systems    BP 127/86  Pulse 84  Temp(Src) 98.7 F (37.1 C) (Oral)  Resp 16  Ht 5' 2.5" (1.588 m)  Wt 181 lb 6.4 oz (82.283 kg)  BMI 32.63 kg/m2  SpO2 98%  LMP 12/16/2012 Objective:   Physical Exam        Results for orders placed in visit on 01/04/13  POCT WET PREP WITH KOH      Result Value Range   Trichomonas, UA Negative     Clue Cells Wet Prep HPF POC tntc     Epithelial Wet Prep HPF POC 6-tntc     Yeast Wet Prep HPF POC neg     Bacteria Wet Prep HPF POC 3+     RBC Wet Prep HPF POC 0-5     WBC Wet Prep HPF POC 0-6     KOH Prep POC Positive      Assessment & Plan:  Family planning - Plan: Ambulatory referral to Gynecology  Vaginal discharge - Plan: POCT Wet Prep with KOH  Bacterial vaginosis - Plan: metroNIDAZOLE (FLAGYL) 500 MG tablet  Vaginal yeast infection  Meds ordered this encounter  Medications  . norelgestromin-ethinyl estradiol (ORTHO EVRA) 150-20 MCG/24HR transdermal patch    Sig: Place 1 patch onto the skin once a week.    Dispense:  3 patch    Refill:  12  . metroNIDAZOLE (FLAGYL) 500 MG tablet    Sig: Take 1 tablet (500 mg total) by mouth 2 (two) times daily.    Dispense:  14 tablet    Refill:  0  . fluconazole (DIFLUCAN) 150 MG tablet    Sig: Take 1 tablet (150 mg total) by mouth once. Repeat if needed    Dispense:  2 tablet    Refill:  0

## 2013-01-17 ENCOUNTER — Ambulatory Visit (INDEPENDENT_AMBULATORY_CARE_PROVIDER_SITE_OTHER): Payer: 59 | Admitting: Women's Health

## 2013-01-17 ENCOUNTER — Encounter: Payer: Self-pay | Admitting: Women's Health

## 2013-01-17 VITALS — BP 130/78 | Ht 62.0 in | Wt 176.5 lb

## 2013-01-17 DIAGNOSIS — Z3009 Encounter for other general counseling and advice on contraception: Secondary | ICD-10-CM

## 2013-01-17 DIAGNOSIS — N898 Other specified noninflammatory disorders of vagina: Secondary | ICD-10-CM

## 2013-01-17 LAB — WET PREP FOR TRICH, YEAST, CLUE: Yeast Wet Prep HPF POC: NONE SEEN

## 2013-01-17 NOTE — Progress Notes (Signed)
Patient ID: Tracy Cervantes, female   DOB: Dec 12, 1985, 27 y.o.   MRN: [id number] New patient presents with complaints of chronic bacterial vaginosis and requests Mirena IUD. Regular monthly cycles lasting 2-3 days on Kariva. Currently menstruating. Plans to start Ortho Evra patch, prescribed by PC. Reports abnormal pap 2009-2010 with biopsy, + HR HPV, normal paps since. Last annual exam and pap April 2014. Reports gonorrhea 2008 and 2012, negative test of cure, negative STD screen with current partner.   Exam: appears well. Speculum exam: menstrual blood, No visible lesions, irritation, or erythema. Wet prep negative. Bimanual no CMT or adnexal fullness or tenderness.  Contraception counseling Overweight History-Positive  high risk HPV Recurrent bacteria vaginosis  Plan: Contraception options discussed and information given on Mirena. Slight risk for infection, perforation, hemorrhage reviewed. Will schedule placement with next period with Dr. Audie Box. Boric acid gel cap per vagina twice weekly to prevent bacterial vaginosis, prescription, proper use given and reviewed, instructed to call if no relief of symptoms. SBE's, calcium rich diet, daily multivitamin, healthy diet and exercise encouraged for weight loss and health.

## 2013-01-17 NOTE — Patient Instructions (Addendum)
Bacterial Vaginosis Bacterial vaginosis (BV) is a vaginal infection where the normal balance of bacteria in the vagina is disrupted. The normal balance is then replaced by an overgrowth of certain bacteria. There are several different kinds of bacteria that can cause BV. BV is the most common vaginal infection in women of childbearing age. CAUSES   The cause of BV is not fully understood. BV develops when there is an increase or imbalance of harmful bacteria.  Some activities or behaviors can upset the normal balance of bacteria in the vagina and put women at increased risk including:  Having a new sex partner or multiple sex partners.  Douching.  Using an intrauterine device (IUD) for contraception.  It is not clear what role sexual activity plays in the development of BV. However, women that have never had sexual intercourse are rarely infected with BV. Women do not get BV from toilet seats, bedding, swimming pools or from touching objects around them.  SYMPTOMS   Grey vaginal discharge.  A fish-like odor with discharge, especially after sexual intercourse.  Itching or burning of the vagina and vulva.  Burning or pain with urination.  Some women have no signs or symptoms at all. DIAGNOSIS  Your caregiver must examine the vagina for signs of BV. Your caregiver will perform lab tests and look at the sample of vaginal fluid through a microscope. They will look for bacteria and abnormal cells (clue cells), a pH test higher than 4.5, and a positive amine test all associated with BV.  RISKS AND COMPLICATIONS   Pelvic inflammatory disease (PID).  Infections following gynecology surgery.  Developing HIV.  Developing herpes virus. TREATMENT  Sometimes BV will clear up without treatment. However, all women with symptoms of BV should be treated to avoid complications, especially if gynecology surgery is planned. Female partners generally do not need to be treated. However, BV may spread  between female sex partners so treatment is helpful in preventing a recurrence of BV.   BV may be treated with antibiotics. The antibiotics come in either pill or vaginal cream forms. Either can be used with nonpregnant or pregnant women, but the recommended dosages differ. These antibiotics are not harmful to the baby.  BV can recur after treatment. If this happens, a second round of antibiotics will often be prescribed.  Treatment is important for pregnant women. If not treated, BV can cause a premature delivery, especially for a pregnant woman who had a premature birth in the past. All pregnant women who have symptoms of BV should be checked and treated.  For chronic reoccurrence of BV, treatment with a type of prescribed gel vaginally twice a week is helpful. HOME CARE INSTRUCTIONS   Finish all medication as directed by your caregiver.  Do not have sex until treatment is completed.  Tell your sexual partner that you have a vaginal infection. They should see their caregiver and be treated if they have problems, such as a mild rash or itching.  Practice safe sex. Use condoms. Only have 1 sex partner. PREVENTION  Basic prevention steps can help reduce the risk of upsetting the natural balance of bacteria in the vagina and developing BV:  Do not have sexual intercourse (be abstinent).  Do not douche.  Use all of the medicine prescribed for treatment of BV, even if the signs and symptoms go away.  Tell your sex partner if you have BV. That way, they can be treated, if needed, to prevent reoccurrence. SEEK MEDICAL CARE IF:     Your symptoms are not improving after 3 days of treatment.  You have increased discharge, pain, or fever. MAKE SURE YOU:   Understand these instructions.  Will watch your condition.  Will get help right away if you are not doing well or get worse. FOR MORE INFORMATION  Division of STD Prevention (DSTDP), Centers for Disease Control and Prevention:  www.cdc.gov/std American Social Health Association (ASHA): www.ashastd.org  Document Released: 08/22/2005 Document Revised: 11/14/2011 Document Reviewed: 02/12/2009 ExitCare Patient Information 2013 ExitCare[employer] LLC.  

## 2013-01-18 ENCOUNTER — Telehealth: Payer: Self-pay | Admitting: Gynecology

## 2013-01-18 NOTE — Telephone Encounter (Signed)
01/18/13-Pt was advised today that her [facility] insurance covers the Mirena IUD and insertion at 100%. She has appt with [facility] for insertion.WL

## 2013-01-22 ENCOUNTER — Other Ambulatory Visit: Payer: Self-pay | Admitting: Gynecology

## 2013-01-22 DIAGNOSIS — Z3049 Encounter for surveillance of other contraceptives: Secondary | ICD-10-CM

## 2013-01-22 MED ORDER — LEVONORGESTREL 20 MCG/24HR IU IUD: INTRAUTERINE_SYSTEM | Freq: Once | INTRAUTERINE | Status: AC

## 2013-02-11 ENCOUNTER — Ambulatory Visit: Payer: 59 | Admitting: Gynecology

## 2013-03-13 ENCOUNTER — Encounter: Payer: Self-pay | Admitting: Gynecology

## 2013-03-13 ENCOUNTER — Ambulatory Visit (INDEPENDENT_AMBULATORY_CARE_PROVIDER_SITE_OTHER): Payer: 59 | Admitting: Gynecology

## 2013-03-13 DIAGNOSIS — Z30430 Encounter for insertion of intrauterine contraceptive device: Secondary | ICD-10-CM

## 2013-03-13 NOTE — Patient Instructions (Signed)
Intrauterine Device Insertion Most often, an intrauterine device (IUD) is inserted into the uterus to prevent pregnancy. There are 2 types of IUDs available:  Copper IUD. This type of IUD creates an environment that is not favorable to sperm survival. The mechanism of action of the copper IUD is not known for certain. It can stay in place for 10 years.  Hormone IUD. This type of IUD contains the hormone progestin (synthetic progesterone). The progestin thickens the cervical mucus and prevents sperm from entering the uterus, and it also thins the uterine lining. There is no evidence that the hormone IUD prevents implantation. The hormone IUD can stay in place for up to 5 years. An IUD is the most cost-effective birth control if left in place for the full duration. It may be removed at any time. LET YOUR CAREGIVER KNOW ABOUT:  Sensitivity to metals.  Medicines taken including herbs, eyedrops, over-the-counter medicines, and creams.  Use of steroids (by mouth or creams).  Previous problems with anesthetics or numbing medicine.  Previous gynecological surgery.  History of blood clots or clotting disorders.  Possibility of pregnancy.  Menstrual irregularities.  Concerns regarding unusual vaginal discharge or odors.  Previous experience with an IUD.  Other health problems. RISKS AND COMPLICATIONS  Accidental puncture (perforation) of the uterus.  Accidental placement of the IUD either in the muscle layer of the uterus (myometrium) or outside the uterus. If this happen, the IUD can be found essentially floating around the bowels. When this happens, the IUD must be taken out surgically.  The IUD may fall out of the uterus (expulsion). This is more common in women who have recently had a child.   Pregnancy in the fallopian tube (ectopic). BEFORE THE PROCEDURE  Schedule the IUD insertion for when you will have your menstrual period or right after, to make sure you are not pregnant.  Placement of the IUD is better tolerated shortly after a menstrual cycle.  You may need to take tests or be examined to make sure you are not pregnant.  You may be required to take a pregnancy test.  You may be required to get checked for sexually transmitted infections (STIs) prior to placement. Placing an IUD in someone who has an infection can make an infection worse.  You may be given a pain reliever to take 1 or 2 hours before the procedure.  An exam will be performed to determine the size and position of your uterus.  Ask your caregiver about changing or stopping your regular medicines. PROCEDURE   A tool (speculum) is placed in the vagina. This allows your caregiver to see the lower part of the uterus (cervix).  The cervix is prepped with a medicine that lowers the risk of infection.  You may be given a medicine to numb each side of the cervix (intracervical or paracervical block). This is used to block and control any discomfort with insertion.  A tool (uterine sound) is inserted into the uterus to determine the length of the uterine cavity and the direction the uterus may be tilted.  A slim instrument (IUD inserter) is inserted through the cervical canal and into your uterus.  The IUD is placed in the uterine cavity and the insertion device is removed.  The nylon string that is attached to the IUD, and used for eventual IUD removal, is trimmed. It is trimmed so that it lays high in the vagina, just outside the cervix. AFTER THE PROCEDURE  You may have bleeding after the   procedure. This is normal. It varies from light spotting for a few days to menstrual-like bleeding.  You may have mild cramping.  Practice checking the string coming out of the cervix to make sure the IUD remains in the uterus. If you cannot feel the string, you should schedule a "string check" with your caregiver.  If you had a hormone IUD inserted, expect that your period may be lighter or nonexistent  within a year's time (though this is not always the case). There may be delayed fertility with the hormone IUD as a result of its progesterone effect. When you are ready to become pregnant, it is suggested to have the IUD removed up to 1 year in advance.  Yearly exams are advised. Document Released: 04/20/2011 Document Revised: 11/14/2011 Document Reviewed: 04/20/2011 ExitCare Patient Information 2014 ExitCare, LLC.  

## 2013-03-13 NOTE — Progress Notes (Signed)
Patient presents for Mirena IUD placement. She has read through the booklet, has no contraindications and signed the consent form. She currently is on a normal menses during her pill free week.  I reviewed the insertional process with her as well as the risks to include infection either immediate or long-term, uterine perforation or migration requiring surgery to remove, other complications such as pain, hormonal side effects and possibilities of failure with subsequent pregnancy.   Exam with [facility] assistant Pelvic: External BUS vagina normal. Cervix normal with menses type flow. Uterus anteverted normal size shape contour midline mobile nontender. Adnexa without masses or tenderness.  Procedure: The cervix was cleansed with Betadine, anterior lip grasped with a single-tooth tenaculum, the uterus was sounded and a Mirena IUD was placed according to manufacturer's recommendations without difficulty. The strings were trimmed. The patient tolerated well and will follow up in one month for a postinsertional check.  Lot number:  [id number]  Note: This document was prepared with digital dictation and possible smart phrase technology. Any transcriptional errors that result from this process are unintentional.

## 2013-04-15 ENCOUNTER — Ambulatory Visit (INDEPENDENT_AMBULATORY_CARE_PROVIDER_SITE_OTHER): Payer: 59 | Admitting: Gynecology

## 2013-04-15 ENCOUNTER — Encounter: Payer: Self-pay | Admitting: Gynecology

## 2013-04-15 DIAGNOSIS — Z30431 Encounter for routine checking of intrauterine contraceptive device: Secondary | ICD-10-CM

## 2013-04-15 NOTE — Patient Instructions (Signed)
Followup of any issues with your Mirena IUD. Otherwise continue to followup annually for your GYN checkups. IUD will need to be replaced/removed in 5 years.

## 2013-04-15 NOTE — Progress Notes (Signed)
Patient presents for IUD followup exam. Mirena IUD was placed 03/13/2013. She's done well since then.  Exam was [facility] assistant Pelvic external BUS vagina normal. Cervix normal with IUD string visualized. String was trimmed. Uterus anteverted normal size midline mobile nontender. Adnexa without masses or tenderness.  Assessment and plan: Normal IUD check. Doing well without menses. We'll continue to monitor. Normally has her routine GYN exam through her primary physician's office and will followup with them for this. Followup with [place] if any issues.

## 2014-01-31 ENCOUNTER — Ambulatory Visit (INDEPENDENT_AMBULATORY_CARE_PROVIDER_SITE_OTHER): Payer: 59 | Admitting: Women's Health

## 2014-01-31 ENCOUNTER — Encounter: Payer: Self-pay | Admitting: Women's Health

## 2014-01-31 VITALS — BP 128/86 | Ht 62.0 in | Wt 180.0 lb

## 2014-01-31 DIAGNOSIS — B9689 Other specified bacterial agents as the cause of diseases classified elsewhere: Secondary | ICD-10-CM

## 2014-01-31 DIAGNOSIS — N949 Unspecified condition associated with female genital organs and menstrual cycle: Secondary | ICD-10-CM

## 2014-01-31 DIAGNOSIS — B3731 Acute candidiasis of vulva and vagina: Secondary | ICD-10-CM

## 2014-01-31 DIAGNOSIS — Z113 Encounter for screening for infections with a predominantly sexual mode of transmission: Secondary | ICD-10-CM

## 2014-01-31 DIAGNOSIS — Z01419 Encounter for gynecological examination (general) (routine) without abnormal findings: Secondary | ICD-10-CM

## 2014-01-31 DIAGNOSIS — A499 Bacterial infection, unspecified: Secondary | ICD-10-CM

## 2014-01-31 DIAGNOSIS — B373 Candidiasis of vulva and vagina: Secondary | ICD-10-CM

## 2014-01-31 DIAGNOSIS — N898 Other specified noninflammatory disorders of vagina: Secondary | ICD-10-CM

## 2014-01-31 DIAGNOSIS — N760 Acute vaginitis: Secondary | ICD-10-CM

## 2014-01-31 LAB — CBC WITH DIFFERENTIAL/PLATELET
BASOS ABS: 0 10*3/uL (ref 0.0–0.1)
BASOS PCT: 0 % (ref 0–1)
EOS ABS: 0.3 10*3/uL (ref 0.0–0.7)
EOS PCT: 4 % (ref 0–5)
HEMATOCRIT: 41.5 % (ref 36.0–46.0)
HEMOGLOBIN: 14.1 g/dL (ref 12.0–15.0)
LYMPHS ABS: 2.1 10*3/uL (ref 0.7–4.0)
LYMPHS PCT: 27 % (ref 12–46)
MCH: 29.3 pg (ref 26.0–34.0)
MCHC: 34 g/dL (ref 30.0–36.0)
MCV: 86.1 fL (ref 78.0–100.0)
MONO ABS: 0.5 10*3/uL (ref 0.1–1.0)
MONOS PCT: 6 % (ref 3–12)
Neutro Abs: 5 10*3/uL (ref 1.7–7.7)
Neutrophils Relative %: 63 % (ref 43–77)
Platelets: 217 10*3/uL (ref 150–400)
RBC: 4.82 MIL/uL (ref 3.87–5.11)
RDW: 13.1 % (ref 11.5–15.5)
WBC: 7.9 10*3/uL (ref 4.0–10.5)

## 2014-01-31 LAB — WET PREP FOR TRICH, YEAST, CLUE: TRICH WET PREP: NONE SEEN

## 2014-01-31 MED ORDER — FLUCONAZOLE 150 MG PO TABS: 150.0000 mg | ORAL_TABLET | Freq: Once | ORAL | Status: DC

## 2014-01-31 MED ORDER — METRONIDAZOLE 0.75 % VA GEL: VAGINAL | Status: DC

## 2014-01-31 NOTE — Progress Notes (Signed)
Tracy Cervantes 07-01-1986 [id number]    History:    Presents for annual exam.  Rare bleeding with Mirena IUD placed 03/2013. Ascus with positive HR HPV with negative colposcopy and biopsy 2009 with normal Paps after. Did not have gardasil. Gonorrhea 2008, 2012, Chlamydia 2013. Positive HSV 1.  Past medical history, past surgical history, family history and social history were all reviewed and documented in the EPIC chart. 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 Mother had a stroke age 39 with good recovery.  ROS:  A  12 point ROS was performed and pertinent positives and negatives are included.  Exam:  Filed Vitals:   01/31/14 1029  BP: 128/86    General appearance:  Normal Thyroid:  Symmetrical, normal in size, without palpable masses or nodularity. Respiratory  Auscultation:  Clear without wheezing or rhonchi Cardiovascular  Auscultation:  Regular rate, without rubs, murmurs or gallops  Edema/varicosities:  Not grossly evident Abdominal  Soft,nontender, without masses, guarding or rebound.  Liver/spleen:  No organomegaly noted  Hernia:  None appreciated  Skin  Inspection:  Grossly normal   Breasts: Examined lying and sitting.     Right: Without masses, retractions, discharge or axillary adenopathy.     Left: Without masses, retractions, discharge or axillary adenopathy. Gentitourinary   Inguinal/mons:  Normal without inguinal adenopathy  External genitalia:  Normal  BUS/Urethra/Skene's glands:  Normal  Vagina:  Vaginal walls erythematous, wet prep positive for yeast, clues, many bacteria  Cervix:  Normal IUD strings visible  Uterus:   normal in size, shape and contour.  Midline and mobile  Adnexa/parametria:     Rt: Without masses or tenderness.   Lt: Without masses or tenderness.  Anus and perineum: Normal  Digital rectal exam: Normal sphincter tone without palpated masses or tenderness  Assessment/Plan:  28 y.o. SAF G0 for annual exam.     Yeast  vaginitis Bacteria vaginosis 03/2013 - Mirena IUD rare spotting Obesity STD screen  Plan: Diflucan 150 by mouth times one dose with refill.  MetroGel vaginal cream 1 applicator at bedtime x5, alcohol precautions reviewed, instructed to call if no relief of discharge. SBE's, regular exercise, calcium rich diet, MVI daily encouraged. Reviewed importance of decreasing calories for weight loss. Condoms encouraged until permanent partner. CBC, UA, Pap normal 2013, new screening guidelines reviewed, GC/Chlamydia, HIV, hep B, C., RPR.  Note: This dictation was prepared with Dragon/digital dictation.  Any transcriptional errors that result are unintentional. Harrington Challenger [facility], 11:56 AM 01/31/2014

## 2014-01-31 NOTE — Patient Instructions (Signed)

## 2014-02-01 LAB — URINALYSIS W MICROSCOPIC + REFLEX CULTURE
BACTERIA UA: NONE SEEN
Bilirubin Urine: NEGATIVE
CRYSTALS: NONE SEEN
Casts: NONE SEEN
Glucose, UA: NEGATIVE mg/dL
HGB URINE DIPSTICK: NEGATIVE
KETONES UR: NEGATIVE mg/dL
Leukocytes, UA: NEGATIVE
NITRITE: NEGATIVE
Protein, ur: NEGATIVE mg/dL
SPECIFIC GRAVITY, URINE: 1.024 (ref 1.005–1.030)
UROBILINOGEN UA: 0.2 mg/dL (ref 0.0–1.0)
pH: 6 (ref 5.0–8.0)

## 2014-02-01 LAB — HEPATITIS C ANTIBODY: HCV Ab: NEGATIVE

## 2014-02-01 LAB — HEPATITIS B SURFACE ANTIGEN: HEP B S AG: NEGATIVE

## 2014-02-01 LAB — GC/CHLAMYDIA PROBE AMP
CT PROBE, AMP APTIMA: NEGATIVE
GC Probe RNA: NEGATIVE

## 2014-02-01 LAB — HIV ANTIBODY (ROUTINE TESTING W REFLEX): HIV: NONREACTIVE

## 2014-02-01 LAB — RPR

## 2014-02-03 DIAGNOSIS — A549 Gonococcal infection, unspecified: Secondary | ICD-10-CM

## 2014-02-03 HISTORY — DX: Gonococcal infection, unspecified: A54.9

## 2014-02-21 ENCOUNTER — Other Ambulatory Visit: Payer: Self-pay | Admitting: Women's Health

## 2014-02-21 ENCOUNTER — Telehealth: Payer: Self-pay

## 2014-02-21 MED ORDER — NONFORMULARY OR COMPOUNDED ITEM: Status: DC

## 2014-02-21 NOTE — Telephone Encounter (Signed)
New rx sent with correct dose.

## 2014-02-21 NOTE — Telephone Encounter (Signed)
Patient has recurrent BV, ok for refill, office visit if no relief, ask her if she has started boric acid?

## 2014-02-21 NOTE — Telephone Encounter (Signed)
Boric acid gel caps to be used to maintain and prevent bacterial vaginosis, will not treat. Okay to refill MetroGel, office visit if no relief of symptoms, if symptoms subside  Boric acid gelcaps per vagina twice weekly #30 with 1 refill.

## 2014-02-21 NOTE — Telephone Encounter (Signed)
Pharmacy called needing to know strength on Boric Acid Capsules.  She said what they usually do is 600mg  but they do a 300 mg as well.

## 2014-02-21 NOTE — Telephone Encounter (Signed)
600mg

## 2014-02-21 NOTE — Telephone Encounter (Signed)
Patient advised. Patient knew nothing of Boric Acid. I do not see it in her medication list.  I explained to her how it balances ph,etc in vagina and hopefully be somewhat preventative to these vag infections.  I explained to her that [facility] but would need a compound pharmacy. [facility]  Please advise re Rx and directions?

## 2014-02-23 ENCOUNTER — Ambulatory Visit (INDEPENDENT_AMBULATORY_CARE_PROVIDER_SITE_OTHER): Payer: 59 | Admitting: Emergency Medicine

## 2014-02-23 VITALS — BP 118/80 | HR 76 | Temp 98.1°F | Resp 16 | Ht 62.0 in | Wt 177.6 lb

## 2014-02-23 DIAGNOSIS — N760 Acute vaginitis: Secondary | ICD-10-CM

## 2014-02-23 DIAGNOSIS — B9689 Other specified bacterial agents as the cause of diseases classified elsewhere: Secondary | ICD-10-CM

## 2014-02-23 DIAGNOSIS — N898 Other specified noninflammatory disorders of vagina: Secondary | ICD-10-CM

## 2014-02-23 DIAGNOSIS — A499 Bacterial infection, unspecified: Secondary | ICD-10-CM

## 2014-02-23 LAB — POCT URINE PREGNANCY: PREG TEST UR: NEGATIVE

## 2014-02-23 LAB — POCT WET PREP WITH KOH
KOH PREP POC: NEGATIVE
RBC Wet Prep HPF POC: NEGATIVE
TRICHOMONAS UA: NEGATIVE
Yeast Wet Prep HPF POC: NEGATIVE

## 2014-02-23 MED ORDER — METRONIDAZOLE 0.75 % VA GEL: Freq: Two times a day (BID) | VAGINAL | Status: DC

## 2014-02-23 NOTE — Patient Instructions (Signed)
Bacterial Vaginosis Bacterial vaginosis is a vaginal infection that occurs when the normal balance of bacteria in the vagina is disrupted. It results from an overgrowth of certain bacteria. This is the most common vaginal infection in women of childbearing age. Treatment is important to prevent complications, especially in pregnant women, as it can cause a premature delivery. CAUSES  Bacterial vaginosis is caused by an increase in harmful bacteria that are normally present in smaller amounts in the vagina. Several different kinds of bacteria can cause bacterial vaginosis. However, the reason that the condition develops is not fully understood. RISK FACTORS Certain activities or behaviors can put you at an increased risk of developing bacterial vaginosis, including:  Having a new sex partner or multiple sex partners.  Douching.  Using an intrauterine device (IUD) for contraception. Women do not get bacterial vaginosis from toilet seats, bedding, swimming pools, or contact with objects around them. SIGNS AND SYMPTOMS  Some women with bacterial vaginosis have no signs or symptoms. Common symptoms include:  Grey vaginal discharge.  A fishlike odor with discharge, especially after sexual intercourse.  Itching or burning of the vagina and vulva.  Burning or pain with urination. DIAGNOSIS  Your health care provider will take a medical history and examine the vagina for signs of bacterial vaginosis. A sample of vaginal fluid may be taken. Your health care provider will look at this sample under a microscope to check for bacteria and abnormal cells. A vaginal pH test may also be done.  TREATMENT  Bacterial vaginosis may be treated with antibiotic medicines. These may be given in the form of a pill or a vaginal cream. A second round of antibiotics may be prescribed if the condition comes back after treatment.  HOME CARE INSTRUCTIONS   Only take over-the-counter or prescription medicines as  directed by your health care provider.  If antibiotic medicine was prescribed, take it as directed. Make sure you finish it even if you start to feel better.  Do not have sex until treatment is completed.  Tell all sexual partners that you have a vaginal infection. They should see their health care provider and be treated if they have problems, such as a mild rash or itching.  Practice safe sex by using condoms and only having one sex partner. SEEK MEDICAL CARE IF:   Your symptoms are not improving after 3 days of treatment.  You have increased discharge or pain.  You have a fever. MAKE SURE YOU:   Understand these instructions.  Will watch your condition.  Will get help right away if you are not doing well or get worse. FOR MORE INFORMATION  Centers for Disease Control and Prevention, Division of STD Prevention: www.cdc.gov/std American Sexual Health Association (ASHA): www.ashastd.org  Document Released: 08/22/2005 Document Revised: 06/12/2013 Document Reviewed: 04/03/2013 ExitCare Patient Information 2015 ExitCare, LLC. This information is not intended to replace advice given to you by your health care provider. Make sure you discuss any questions you have with your health care provider.  

## 2014-02-23 NOTE — Progress Notes (Signed)
Subjective:  This chart was scribed for Lesle ChrisSteven Daub, MD by Elveria Risingimelie Horne, Medial Scribe. This patient was seen in room 13 and the patient's care was started at 9:03 AM.    Patient ID: Tracy Cervantes, female    DOB: 1985/10/31, 28 y.o.   MRN: [id number]  HPI HPI Comments: Tracy Cervantes is a 28 y.o. female who presents to the [facility] complaining of vaginal discharge and odor. Patient shares that she develops BV after every sexual encounter. Patient reports recent intercourse with a new sexual partner with protection. Patient reports having a pap smear one month ago. Patient currently taking Amoxicillin for her strep throat; she suspects she may have a yeast infection. Patient reports historical BV treatment with gel and capsule; states that both are equally effective. Patient reports IUD insertion one year ago.   Patient Active Problem List   Diagnosis Date Noted  . Health care maintenance 01/01/2012  . Overweight (BMI 25.0-29.9) 01/01/2012  . Cervical high risk HPV (human papillomavirus) test positive 01/01/2012   Past Medical History  Diagnosis Date  . Allergy    Past Surgical History  Procedure Laterality Date  . Appendectomy    . Mirena      Inserted 03-13-13   No Known Allergies Prior to Admission medications   Medication Sig Start Date End Date Taking? Authorizing Provider  NONFORMULARY OR COMPOUNDED ITEM Boric Acid Capsules 600 mg. S:insert capsule vaginally twice weekly. 02/21/14  Yes Harrington ChallengerNancy J Young, NP  fluconazole (DIFLUCAN) 150 MG tablet Take 1 tablet (150 mg total) by mouth once. 01/31/14   Harrington ChallengerNancy J Young, NP  metroNIDAZOLE (METROGEL) 0.75 % vaginal gel INSERT 1 APPLICATORFUL VAGINALLY AT BEDTIME X 5 NIGHTS 02/21/14   Harrington ChallengerNancy J Young, NP   History   Social History  . Marital Status: Single    Spouse Name: N/A    Number of Children: N/A  . Years of Education: N/A   Occupational History  . Not on file.   Social History Main Topics  . Smoking status:  Never Smoker   . Smokeless tobacco: Not on file  . Alcohol Use: Yes     Comment: occassional  . Drug Use: No  . Sexual Activity: Yes    Birth Control/ Protection: IUD     Comment: Mirena inserted 03-13-13   Other Topics Concern  . Not on file   Social History Narrative  . No narrative on file      Review of Systems  Constitutional: Negative for fever.  Genitourinary: Positive for vaginal discharge and vaginal pain.       Objective:   Physical Exam  CONSTITUTIONAL: Well developed/well nourished HEAD: Normocephalic/atraumatic EYES: EOMI/PERRL ENMT: Mucous membranes moist NECK: supple no meningeal signs SPINE:entire spine nontender CV: S1/S2 noted, no murmurs/rubs/gallops noted LUNGS: Lungs are clear to auscultation bilaterally, no apparent distress ABDOMEN: soft, nontender, no rebound or guarding GU:no cva tenderness   PELVIC: White, yellowish discharge in vault; mild cervical erosion, no uterine tenderness, no adnexal masses NEURO: Pt is awake/alert, moves all extremitiesx4 EXTREMITIES: pulses normal, full ROM SKIN: warm, color normal PSYCH: no abnormalities of mood noted  Filed Vitals:   02/23/14 0855  BP: 118/80  Pulse: 76  Temp: 98.1 F (36.7 C)  Resp: 16   Results for orders placed in visit on 02/23/14  POCT WET PREP WITH KOH      Result Value Ref Range   Trichomonas, UA Negative     Clue Cells Wet Prep HPF  POC 25%     Epithelial Wet Prep HPF POC 3-5     Yeast Wet Prep HPF POC neg     Bacteria Wet Prep HPF POC 1+     RBC Wet Prep HPF POC neg     WBC Wet Prep HPF POC 5-7     KOH Prep POC Negative    POCT URINE PREGNANCY      Result Value Ref Range   Preg Test, Ur Negative          Assessment & Plan:  URiprobe was done we'll treat with an MetroGel vaginal. Recheck if symptoms persist I did advise her to take probiotics. I personally performed the services described in this documentation, which was scribed in my presence. The recorded information  has been reviewed and is accurate.

## 2014-02-24 LAB — GC/CHLAMYDIA PROBE AMP
CT PROBE, AMP APTIMA: NEGATIVE
GC PROBE AMP APTIMA: POSITIVE — AB

## 2014-02-26 ENCOUNTER — Ambulatory Visit (INDEPENDENT_AMBULATORY_CARE_PROVIDER_SITE_OTHER): Payer: 59 | Admitting: Family Medicine

## 2014-02-26 VITALS — BP 108/80 | HR 69 | Temp 98.0°F | Resp 16 | Ht 62.0 in | Wt 178.0 lb

## 2014-02-26 DIAGNOSIS — A5400 Gonococcal infection of lower genitourinary tract, unspecified: Secondary | ICD-10-CM

## 2014-02-26 DIAGNOSIS — A549 Gonococcal infection, unspecified: Secondary | ICD-10-CM

## 2014-02-26 MED ORDER — AZITHROMYCIN 500 MG PO TABS: 1000.0000 mg | ORAL_TABLET | Freq: Once | ORAL | Status: DC

## 2014-02-26 MED ORDER — CEFTRIAXONE SODIUM 1 G IJ SOLR
250.0000 mg | Freq: Once | INTRAMUSCULAR | Status: AC
  Administered 2014-02-26: 250 mg via INTRAMUSCULAR

## 2014-02-26 NOTE — Progress Notes (Signed)
   Subjective:    Patient ID: Tracy Cervantes, female    DOB: 1986/03/05, 28 y.o.   MRN: [id number]  HPI Patient here for STD treatment. Tested positive for gonorrhea. Had 1 new sexual partner with one encounter and does not plan to have any further intercourse with this partner. She did inform her partner of her positive STD test. She does use condoms. HIV and RPR one month ago was negative, prior to this sexual encounter.  Review of Systems  All other systems reviewed and are negative.     Objective:   Physical Exam  Constitutional: She is oriented to person, place, and time. She appears well-developed and well-nourished.  HENT:  Head: Normocephalic and atraumatic.  Eyes: Conjunctivae are normal. Pupils are equal, round, and reactive to light. No scleral icterus.  Cardiovascular: Normal rate.   Pulmonary/Chest: Effort normal.  Musculoskeletal: Normal range of motion.  Neurological: She is alert and oriented to person, place, and time.  Skin: Skin is warm and dry.  Psychiatric: She has a normal mood and affect. Her behavior is normal.      Assessment & Plan:  #1. STD treatment - Rocephin 250 mg IM for gonorrhea - Will also cover chlamydia with 1000 mg azithromycin - Counsel safe sex - Recommend routine STD screening.

## 2014-02-26 NOTE — Addendum Note (Signed)
Addended by: Cydney OkAUGUSTIN, TAMARA N on: 02/26/2014 06:56 PM   Modules accepted: Orders

## 2014-02-26 NOTE — Patient Instructions (Signed)
Thank you for coming in today  Rocephin injection for gonorrhea today Pick up azithromycin at the pharmacy for chlamydia Use condoms with all sexual intercourse to prevent STDs Recommend routine STD screening every 6-12 months  Gonorrhea Gonorrhea is an infection that can cause serious problems. If left untreated, may   Damage the female or female organs.   Cause women to be unable to have children (sterility).   Harm a fetus, if the infected woman is pregnant.  It is important to get treatment for gonorrhea as soon as possible. It is also necessary that all your sexual partners be tested for the infection.  CAUSES  Gonorrhea is caused by bacteria called Neisseria gonorrhoeae. The infection is spread from person to person, usually by sexual contact (such as by anal, vaginal, or oral means). A newborn can contract the infection from his or her mother during birth.  SYMPTOMS  Some people with gonorrhea do not have symptoms. Symptoms may be different in females and males.  Females The most common symptoms are:   Pain in the lower abdomen.   Fever with or without chills.  Other symptoms include:   Abnormal vaginal discharge.   Painful intercourse.   Burning or itching of the vagina or lips of the vagina.   Abnormal vaginal bleeding.   Pain when urinating.   Long-lasting (chronic) pain in the lower abdomen, especially during menstruation or intercourse.   Inability to become pregnant.   Going into premature labor.   Irritation, pain, bleeding, or discharge from the rectum. This may occur if the infection was spread by anal sex.   Sore throat or swollen neck lymph nodes. This may occur if the infection was spread by oral sex.  Males The most common symptoms are:   Discharge from the penis.   Pain or burning during urination.   Pain or swelling in the testicles. Other symptoms may include:   Irritation, pain, bleeding, or discharge from the rectum.  This may occur if the infection was spread by anal sex.   Sore throat, fever, or swollen neck lymph nodes. This may occur if the infection was spread by oral sex.  DIAGNOSIS  A diagnosis is made after a physical exam is done and a sample of discharge is examined under a microscope for the presence of the bacteria. The discharge may be taken from the urethra, cervix, throat, or rectum.  TREATMENT  Gonorrhea is treated with antibiotic medicines. It is important for treatment to begin as soon as possible. Early treatment may prevent some problems from developing.  HOME CARE INSTRUCTIONS   Only take over-the-counter or prescription medicines for pain, fever, or discomfort as directed by your health care Tracy Cervantes.   Take antibiotics as directed. Make sure you finish them even if you start to feel better. Incomplete treatment will put you at risk for continued infection.   Do not have sex until treatment is complete or as directed by your health care Tracy Cervantes.   Follow up with your health care Tracy Cervantes as directed.   Not all test results are available during your visit. If your test results are not back during the visit, make an appointment with your health care Tracy Cervantes to find out the results. Do not assume everything is normal if you have not heard from your health care Tracy Cervantes or the medical facility. It is important for you to follow up on all of your test results.   If you test positive for gonorrhea, inform your recent sexual  partners. They need to be checked for gonorrhea even if they do not have symptoms. They may need treatment, even if they test negative for gonorrhea.  SEEK MEDICAL CARE IF:   You develop any bad reaction to the medicine you were prescribed. This may include:   A rash.   Nausea.   Vomiting.   Diarrhea.   Your symptoms do not improve after a few days of taking antibiotics.   Your symptoms get worse.   You develop increased pain, such as in the  testicles (for males) or in the abdomen (for females).  SEEK IMMEDIATE MEDICAL CARE IF:  You have a fever or persistent symptoms for more than 2-3 days.   You have a fever and your symptoms suddenly get worse.  MAKE SURE YOU:   Understand these instructions.  Will watch your condition.  Will get help right away if you are not doing well or get worse. Document Released: 08/19/2000 Document Revised: 06/12/2013 Document Reviewed: 02/27/2013 [facility] Patient Information 2015 [place]. This information is not intended to replace advice given to you by your health care Tracy Cervantes. Make sure you discuss any questions you have with your health care Tracy Cervantes.

## 2014-02-27 ENCOUNTER — Telehealth: Payer: Self-pay

## 2014-02-27 MED ORDER — AZITHROMYCIN 500 MG PO TABS: 1000.0000 mg | ORAL_TABLET | Freq: Once | ORAL | Status: DC

## 2014-02-27 NOTE — Telephone Encounter (Signed)
[facility] Notified pt.

## 2014-02-27 NOTE — Telephone Encounter (Signed)
Pt said her azithromycin (ZITHROMAX) 500 MG tablet was sent to the wrong pharmacy, [facility] [address]. Please advise pt when this has been done

## 2014-04-01 ENCOUNTER — Encounter: Payer: Self-pay | Admitting: Gynecology

## 2014-04-01 ENCOUNTER — Ambulatory Visit (INDEPENDENT_AMBULATORY_CARE_PROVIDER_SITE_OTHER): Payer: 59 | Admitting: Gynecology

## 2014-04-01 DIAGNOSIS — B3731 Acute candidiasis of vulva and vagina: Secondary | ICD-10-CM

## 2014-04-01 DIAGNOSIS — Z113 Encounter for screening for infections with a predominantly sexual mode of transmission: Secondary | ICD-10-CM

## 2014-04-01 DIAGNOSIS — N898 Other specified noninflammatory disorders of vagina: Secondary | ICD-10-CM

## 2014-04-01 DIAGNOSIS — B373 Candidiasis of vulva and vagina: Secondary | ICD-10-CM

## 2014-04-01 LAB — WET PREP FOR TRICH, YEAST, CLUE
Clue Cells Wet Prep HPF POC: NONE SEEN
TRICH WET PREP: NONE SEEN

## 2014-04-01 MED ORDER — FLUCONAZOLE 150 MG PO TABS: 150.0000 mg | ORAL_TABLET | Freq: Once | ORAL | Status: DC

## 2014-04-01 NOTE — Patient Instructions (Signed)
Take one Diflucan pill. Followup if the vaginal discharge continues.  Check on the My Chart site for your STD screening results.

## 2014-04-01 NOTE — Progress Notes (Signed)
Tracy Cervantes 1986-06-15 [id number]        28 y.o.  G0P0 presents complaining of persistent vaginal discharge over the last 2 months. Seemed to get somewhat better but still a nagging irritation. No odor or urinary symptoms such as frequency dysuria urgency. The patient also requests STD screening. Was recently treated for gonorrhea 02/2014.  Past medical history,surgical history, problem list, medications, allergies, family history and social history were all reviewed and documented in the EPIC chart.  Directed ROS with pertinent positives and negatives documented in the history of present illness/assessment and plan.  Exam: Kim assistant General appearance:  Normal Abdomen soft nontender without masses guarding rebound Pelvic external BUS vagina with white discharge. Cervix normal IUD string visualized. Uterus normal size midline mobile nontender. Adnexa without masses or tenderness.  Assessment/Plan:  28 y.o. G0P0 with exam, symptoms and wet prep consistent with yeast vaginitis. Treat with Diflucan 150 mg x1 dose. STD screening performed at her request to include GC/chlamydia, hepatitis B/C, RPR, HIV followup if symptoms persist, worsen or recur.   Note: This document was prepared with digital dictation and possible smart phrase technology. Any transcriptional errors that result from this process are unintentional.   Dara LordsFONTAINE,TIMOTHY P MD, 10:29 AM 04/01/2014

## 2014-04-02 LAB — HEPATITIS C ANTIBODY: HCV AB: NEGATIVE

## 2014-04-02 LAB — HIV ANTIBODY (ROUTINE TESTING W REFLEX): HIV 1&2 Ab, 4th Generation: NONREACTIVE

## 2014-04-02 LAB — HEPATITIS B SURFACE ANTIGEN: HEP B S AG: NEGATIVE

## 2014-04-02 LAB — GC/CHLAMYDIA PROBE AMP
CT PROBE, AMP APTIMA: NEGATIVE
GC Probe RNA: NEGATIVE

## 2014-04-02 LAB — RPR

## 2015-02-03 ENCOUNTER — Other Ambulatory Visit: Payer: Self-pay | Admitting: Women's Health

## 2015-02-03 ENCOUNTER — Encounter: Payer: Self-pay | Admitting: Women's Health

## 2015-02-03 ENCOUNTER — Ambulatory Visit (INDEPENDENT_AMBULATORY_CARE_PROVIDER_SITE_OTHER): Payer: 59 | Admitting: Women's Health

## 2015-02-03 VITALS — BP 118/80 | Ht 62.0 in | Wt 191.0 lb

## 2015-02-03 DIAGNOSIS — Z113 Encounter for screening for infections with a predominantly sexual mode of transmission: Secondary | ICD-10-CM | POA: Diagnosis not present

## 2015-02-03 DIAGNOSIS — Z01419 Encounter for gynecological examination (general) (routine) without abnormal findings: Secondary | ICD-10-CM

## 2015-02-03 LAB — TSH: TSH: 0.829 u[IU]/mL (ref 0.350–4.500)

## 2015-02-03 LAB — CBC WITH DIFFERENTIAL/PLATELET
BASOS PCT: 0 % (ref 0–1)
Basophils Absolute: 0 10*3/uL (ref 0.0–0.1)
Eosinophils Absolute: 0.4 10*3/uL (ref 0.0–0.7)
Eosinophils Relative: 4 % (ref 0–5)
HCT: 40.4 % (ref 36.0–46.0)
Hemoglobin: 13.2 g/dL (ref 12.0–15.0)
Lymphocytes Relative: 22 % (ref 12–46)
Lymphs Abs: 2 10*3/uL (ref 0.7–4.0)
MCH: 29.4 pg (ref 26.0–34.0)
MCHC: 32.7 g/dL (ref 30.0–36.0)
MCV: 90 fL (ref 78.0–100.0)
MPV: 10.6 fL (ref 8.6–12.4)
Monocytes Absolute: 0.7 10*3/uL (ref 0.1–1.0)
Monocytes Relative: 8 % (ref 3–12)
Neutro Abs: 5.9 10*3/uL (ref 1.7–7.7)
Neutrophils Relative %: 66 % (ref 43–77)
Platelets: 256 10*3/uL (ref 150–400)
RBC: 4.49 MIL/uL (ref 3.87–5.11)
RDW: 13.1 % (ref 11.5–15.5)
WBC: 8.9 10*3/uL (ref 4.0–10.5)

## 2015-02-03 LAB — LIPID PANEL
CHOL/HDL RATIO: 4 ratio
CHOLESTEROL: 203 mg/dL — AB (ref 0–200)
HDL: 51 mg/dL (ref >=46)
LDL Cholesterol: 125 mg/dL — ABNORMAL HIGH (ref 0–99)
Triglycerides: 137 mg/dL (ref <150)
VLDL: 27 mg/dL (ref 0–40)

## 2015-02-03 LAB — GLUCOSE, RANDOM: GLUCOSE: 107 mg/dL — AB (ref 70–99)

## 2015-02-03 LAB — WET PREP FOR TRICH, YEAST, CLUE
Clue Cells Wet Prep HPF POC: NONE SEEN
TRICH WET PREP: NONE SEEN
Yeast Wet Prep HPF POC: NONE SEEN

## 2015-02-03 NOTE — Progress Notes (Signed)
Tracy Cervantes May 08, 1986 [id number]    History:    Presents for annual exam.  Mirena IUD placed 03/2013 amenorrheic. 2009 ascus with positive HR HPV with negative colposcopy and biopsy and normal Paps after. History of positive GC 2008, 2015, Chlamydia 2012 all had negative test of cure after. New partner. Not sure if she had gardasil.  Past medical history, past surgical history, family history and social history were all reviewed and documented in the EPIC chart. 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 Mother stroke doing well.  ROS:  A ROS was performed and pertinent positives and negatives are included.  Exam:  Filed Vitals:   02/03/15 0820  BP: 118/80    General appearance:  Normal Thyroid:  Symmetrical, normal in size, without palpable masses or nodularity. Respiratory  Auscultation:  Clear without wheezing or rhonchi Cardiovascular  Auscultation:  Regular rate, without rubs, murmurs or gallops  Edema/varicosities:  Not grossly evident Abdominal  Soft,nontender, without masses, guarding or rebound.  Liver/spleen:  No organomegaly noted  Hernia:  None appreciated  Skin  Inspection:  Grossly normal   Breasts: Examined lying and sitting.     Right: Without masses, retractions, discharge or axillary adenopathy.     Left: Without masses, retractions, discharge or axillary adenopathy. Gentitourinary   Inguinal/mons:  Normal without inguinal adenopathy  External genitalia:  Normal  BUS/Urethra/Skene's glands:  Normal  Vagina:  Normal wet prep negative  Cervix:  Normal IUD strings visible  Uterus:   normal in size, shape and contour.  Midline and mobile  Adnexa/parametria:     Rt: Without masses or tenderness.   Lt: Without masses or tenderness.  Anus and perineum: Normal  Digital rectal exam: Normal sphincter tone without palpated masses or tenderness  Assessment/Plan:  29 y.o. SHF G0 for annual exam with complaint of occasional cramping after intercourse.  03/2013 Mirena  IUD-amenorrhea STD screen Obesity  Plan: Contraception options reviewed, will continue with Mirena IUD. SBE's, reviewed importance of increasing regular exercise and decreasing calories for weight loss, has gained 11 pounds in the past year, calcium rich diet, MVI daily encouraged. CBC, glucose, lipid panel, TSH, UA, GC/Chlamydia, HIV, hep B, C, RPR. Pap normal 2014 will check Pap with HR HPV next year. New screening guidelines reviewed.    Harrington ChallengerYOUNG,NANCY J [facility] CenterWHNP, 9:21 AM 02/03/2015

## 2015-02-03 NOTE — Patient Instructions (Signed)
Health Maintenance Adopting a healthy lifestyle and getting preventive care can go a long way to promote health and wellness. Talk with your health care provider about what schedule of regular examinations is right for you. This is a good chance for you to check in with your provider about disease prevention and staying healthy. In between checkups, there are plenty of things you can do on your own. Experts have done a lot of research about which lifestyle changes and preventive measures are most likely to keep you healthy. Ask your health care provider for more information. WEIGHT AND DIET  Eat a healthy diet  Be sure to include plenty of vegetables, fruits, low-fat dairy products, and lean protein.  Do not eat a lot of foods high in solid fats, added sugars, or salt.  Get regular exercise. This is one of the most important things you can do for your health.  Most adults should exercise for at least 150 minutes each week. The exercise should increase your heart rate and make you sweat (moderate-intensity exercise).  Most adults should also do strengthening exercises at least twice a week. This is in addition to the moderate-intensity exercise.  Maintain a healthy weight  Body mass index (BMI) is a measurement that can be used to identify possible weight problems. It estimates body fat based on height and weight. Your health care provider can help determine your BMI and help you achieve or maintain a healthy weight.  For females 25 years of age and older:   A BMI below 18.5 is considered underweight.  A BMI of 18.5 to 24.9 is normal.  A BMI of 25 to 29.9 is considered overweight.  A BMI of 30 and above is considered obese.  Watch levels of cholesterol and blood lipids  You should start having your blood tested for lipids and cholesterol at 30 years of age, then have this test every 5 years.  You may need to have your cholesterol levels checked more often if:  Your lipid or  cholesterol levels are high.  You are older than 30 years of age.  You are at high risk for heart disease.  CANCER SCREENING   Lung Cancer  Lung cancer screening is recommended for adults 97-92 years old who are at high risk for lung cancer because of a history of smoking.  A yearly low-dose CT scan of the lungs is recommended for people who:  Currently smoke.  Have quit within the past 15 years.  Have at least a 30-pack-year history of smoking. A pack year is smoking an average of one pack of cigarettes a day for 1 year.  Yearly screening should continue until it has been 15 years since you quit.  Yearly screening should stop if you develop a health problem that would prevent you from having lung cancer treatment.  Breast Cancer  Practice breast self-awareness. This means understanding how your breasts normally appear and feel.  It also means doing regular breast self-exams. Let your health care provider know about any changes, no matter how small.  If you are in your 20s or 30s, you should have a clinical breast exam (CBE) by a health care provider every 1-3 years as part of a regular health exam.  If you are 76 or older, have a CBE every year. Also consider having a breast X-ray (mammogram) every year.  If you have a family history of breast cancer, talk to your health care provider about genetic screening.  If you are  at high risk for breast cancer, talk to your health care provider about having an MRI and a mammogram every year.  Breast cancer gene (BRCA) assessment is recommended for women who have family members with BRCA-related cancers. BRCA-related cancers include:  Breast.  Ovarian.  Tubal.  Peritoneal cancers.  Results of the assessment will determine the need for genetic counseling and BRCA1 and BRCA2 testing. Cervical Cancer Routine pelvic examinations to screen for cervical cancer are no longer recommended for nonpregnant women who are considered low  risk for cancer of the pelvic organs (ovaries, uterus, and vagina) and who do not have symptoms. A pelvic examination may be necessary if you have symptoms including those associated with pelvic infections. Ask your health care provider if a screening pelvic exam is right for you.   The Pap test is the screening test for cervical cancer for women who are considered at risk.  If you had a hysterectomy for a problem that was not cancer or a condition that could lead to cancer, then you no longer need Pap tests.  If you are older than 65 years, and you have had normal Pap tests for the past 10 years, you no longer need to have Pap tests.  If you have had past treatment for cervical cancer or a condition that could lead to cancer, you need Pap tests and screening for cancer for at least 20 years after your treatment.  If you no longer get a Pap test, assess your risk factors if they change (such as having a new sexual partner). This can affect whether you should start being screened again.  Some women have medical problems that increase their chance of getting cervical cancer. If this is the case for you, your health care provider may recommend more frequent screening and Pap tests.  The human papillomavirus (HPV) test is another test that may be used for cervical cancer screening. The HPV test looks for the virus that can cause cell changes in the cervix. The cells collected during the Pap test can be tested for HPV.  The HPV test can be used to screen women 30 years of age and older. Getting tested for HPV can extend the interval between normal Pap tests from three to five years.  An HPV test also should be used to screen women of any age who have unclear Pap test results.  After 30 years of age, women should have HPV testing as often as Pap tests.  Colorectal Cancer  This type of cancer can be detected and often prevented.  Routine colorectal cancer screening usually begins at 30 years of  age and continues through 30 years of age.  Your health care provider may recommend screening at an earlier age if you have risk factors for colon cancer.  Your health care provider may also recommend using home test kits to check for hidden blood in the stool.  A small camera at the end of a tube can be used to examine your colon directly (sigmoidoscopy or colonoscopy). This is done to check for the earliest forms of colorectal cancer.  Routine screening usually begins at age 50.  Direct examination of the colon should be repeated every 5-10 years through 30 years of age. However, you may need to be screened more often if early forms of precancerous polyps or small growths are found. Skin Cancer  Check your skin from head to toe regularly.  Tell your health care provider about any new moles or changes in   moles, especially if there is a change in a mole's shape or color.  Also tell your health care provider if you have a mole that is larger than the size of a pencil eraser.  Always use sunscreen. Apply sunscreen liberally and repeatedly throughout the day.  Protect yourself by wearing long sleeves, pants, a wide-brimmed hat, and sunglasses whenever you are outside. HEART DISEASE, DIABETES, AND HIGH BLOOD PRESSURE   Have your blood pressure checked at least every 1-2 years. High blood pressure causes heart disease and increases the risk of stroke.  If you are between 75 years and 42 years old, ask your health care provider if you should take aspirin to prevent strokes.  Have regular diabetes screenings. This involves taking a blood sample to check your fasting blood sugar level.  If you are at a normal weight and have a low risk for diabetes, have this test once every three years after 30 years of age.  If you are overweight and have a high risk for diabetes, consider being tested at a younger age or more often. PREVENTING INFECTION  Hepatitis B  If you have a higher risk for  hepatitis B, you should be screened for this virus. You are considered at high risk for hepatitis B if:  You were born in a country where hepatitis B is common. Ask your health care provider which countries are considered high risk.  Your parents were born in a high-risk country, and you have not been immunized against hepatitis B (hepatitis B vaccine).  You have HIV or AIDS.  You use needles to inject street drugs.  You live with someone who has hepatitis B.  You have had sex with someone who has hepatitis B.  You get hemodialysis treatment.  You take certain medicines for conditions, including cancer, organ transplantation, and autoimmune conditions. Hepatitis C  Blood testing is recommended for:  Everyone born from [birth year] through 1966.  Anyone with known risk factors for hepatitis C. Sexually transmitted infections (STIs)  You should be screened for sexually transmitted infections (STIs) including gonorrhea and chlamydia if:  You are sexually active and are younger than 30 years of age.  You are older than 30 years of age and your health care provider tells you that you are at risk for this type of infection.  Your sexual activity has changed since you were last screened and you are at an increased risk for chlamydia or gonorrhea. Ask your health care provider if you are at risk.  If you do not have HIV, but are at risk, it may be recommended that you take a prescription medicine daily to prevent HIV infection. This is called pre-exposure prophylaxis (PrEP). You are considered at risk if:  You are sexually active and do not regularly use condoms or know the HIV status of your partner(s).  You take drugs by injection.  You are sexually active with a partner who has HIV. Talk with your health care provider about whether you are at high risk of being infected with HIV. If you choose to begin PrEP, you should first be tested for HIV. You should then be tested every 3 months for  as long as you are taking PrEP.  PREGNANCY   If you are premenopausal and you may become pregnant, ask your health care provider about preconception counseling.  If you may become pregnant, take 400 to 800 micrograms (mcg) of folic acid every day.  If you want to prevent pregnancy, talk to your  health care provider about birth control (contraception). OSTEOPOROSIS AND MENOPAUSE   Osteoporosis is a disease in which the bones lose minerals and strength with aging. This can result in serious bone fractures. Your risk for osteoporosis can be identified using a bone density scan.  If you are 65 years of age or older, or if you are at risk for osteoporosis and fractures, ask your health care provider if you should be screened.  Ask your health care provider whether you should take a calcium or vitamin D supplement to lower your risk for osteoporosis.  Menopause may have certain physical symptoms and risks.  Hormone replacement therapy may reduce some of these symptoms and risks. Talk to your health care provider about whether hormone replacement therapy is right for you.  HOME CARE INSTRUCTIONS   Schedule regular health, dental, and eye exams.  Stay current with your immunizations.   Do not use any tobacco products including cigarettes, chewing tobacco, or electronic cigarettes.  If you are pregnant, do not drink alcohol.  If you are breastfeeding, limit how much and how often you drink alcohol.  Limit alcohol intake to no more than 1 drink per day for nonpregnant women. One drink equals 12 ounces of beer, 5 ounces of wine, or 1 ounces of hard liquor.  Do not use street drugs.  Do not share needles.  Ask your health care provider for help if you need support or information about quitting drugs.  Tell your health care provider if you often feel depressed.  Tell your health care provider if you have ever been abused or do not feel safe at home. Document Released: 03/07/2011  Document Revised: 01/06/2014 Document Reviewed: 07/24/2013 ExitCare Patient Information 2015 [employer]. This information is not intended to replace advice given to you by your health care provider. Make sure you discuss any questions you have with your health care provider. Exercise to Stay Healthy Exercise helps you become and stay healthy. EXERCISE IDEAS AND TIPS Choose exercises that:  You enjoy.  Fit into your day. You do not need to exercise really hard to be healthy. You can do exercises at a slow or medium level and stay healthy. You can:  Stretch before and after working out.  Try yoga, Pilates, or tai chi.  Lift weights.  Walk fast, swim, jog, run, climb stairs, bicycle, dance, or rollerskate.  Take aerobic classes. Exercises that burn about 150 calories:  Running 1  miles in 15 minutes.  Playing volleyball for 45 to 60 minutes.  Washing and waxing a car for 45 to 60 minutes.  Playing touch football for 45 minutes.  Walking 1  miles in 35 minutes.  Pushing a stroller 1  miles in 30 minutes.  Playing basketball for 30 minutes.  Raking leaves for 30 minutes.  Bicycling 5 miles in 30 minutes.  Walking 2 miles in 30 minutes.  Dancing for 30 minutes.  Shoveling snow for 15 minutes.  Swimming laps for 20 minutes.  Walking up stairs for 15 minutes.  Bicycling 4 miles in 15 minutes.  Gardening for 30 to 45 minutes.  Jumping rope for 15 minutes.  Washing windows or floors for 45 to 60 minutes. Document Released: 09/24/2010 Document Revised: 11/14/2011 Document Reviewed: 09/24/2010 ExitCare Patient Information 2015 [employer]. This information is not intended to replace advice given to you by your health care provider. Make sure you discuss any questions you have with your health care provider.  

## 2015-02-04 LAB — HEMOGLOBIN A1C
HEMOGLOBIN A1C: 5.4 % (ref <5.7)
Mean Plasma Glucose: 108 mg/dL (ref <117)

## 2015-02-04 LAB — URINALYSIS W MICROSCOPIC + REFLEX CULTURE
BILIRUBIN URINE: NEGATIVE
Casts: NONE SEEN
Crystals: NONE SEEN
Glucose, UA: NEGATIVE mg/dL
Hgb urine dipstick: NEGATIVE
Ketones, ur: NEGATIVE mg/dL
Leukocytes, UA: NEGATIVE
Nitrite: NEGATIVE
PH: 5 (ref 5.0–8.0)
Protein, ur: NEGATIVE mg/dL
Specific Gravity, Urine: 1.029 (ref 1.005–1.030)
UROBILINOGEN UA: 0.2 mg/dL (ref 0.0–1.0)

## 2015-02-04 LAB — GC/CHLAMYDIA PROBE AMP
CT Probe RNA: NEGATIVE
GC PROBE AMP APTIMA: NEGATIVE

## 2015-02-04 LAB — HIV ANTIBODY (ROUTINE TESTING W REFLEX): HIV: NONREACTIVE

## 2015-02-04 LAB — HEPATITIS C ANTIBODY: HCV Ab: NEGATIVE

## 2015-02-04 LAB — HEPATITIS B SURFACE ANTIGEN: Hepatitis B Surface Ag: NEGATIVE

## 2015-02-04 LAB — RPR

## 2015-02-06 LAB — URINE CULTURE: Colony Count: 30000

## 2015-06-08 ENCOUNTER — Ambulatory Visit (INDEPENDENT_AMBULATORY_CARE_PROVIDER_SITE_OTHER): Payer: 59 | Admitting: Urgent Care

## 2015-06-08 ENCOUNTER — Encounter: Payer: Self-pay | Admitting: Urgent Care

## 2015-06-08 VITALS — BP 143/85 | HR 76 | Temp 99.0°F | Resp 16 | Ht 62.5 in | Wt 204.2 lb

## 2015-06-08 DIAGNOSIS — J209 Acute bronchitis, unspecified: Secondary | ICD-10-CM

## 2015-06-08 DIAGNOSIS — R059 Cough, unspecified: Secondary | ICD-10-CM

## 2015-06-08 DIAGNOSIS — R05 Cough: Secondary | ICD-10-CM

## 2015-06-08 MED ORDER — HYDROCODONE-HOMATROPINE 5-1.5 MG/5ML PO SYRP: 5.0000 mL | ORAL_SOLUTION | Freq: Every evening | ORAL | Status: DC | PRN

## 2015-06-08 MED ORDER — AZITHROMYCIN 250 MG PO TABS: ORAL_TABLET | ORAL | Status: DC

## 2015-06-08 MED ORDER — BENZONATATE 100 MG PO CAPS: 100.0000 mg | ORAL_CAPSULE | Freq: Three times a day (TID) | ORAL | Status: DC | PRN

## 2015-06-08 NOTE — Patient Instructions (Addendum)
-   Please take Zyrtec for postnasal drainage.  Acute Bronchitis Bronchitis is inflammation of the airways that extend from the windpipe into the lungs (bronchi). The inflammation often causes mucus to develop. This leads to a cough, which is the most common symptom of bronchitis.  In acute bronchitis, the condition usually develops suddenly and goes away over time, usually in a couple weeks. Smoking, allergies, and asthma can make bronchitis worse. Repeated episodes of bronchitis may cause further lung problems.  CAUSES Acute bronchitis is most often caused by the same virus that causes a cold. The virus can spread from person to person (contagious) through coughing, sneezing, and touching contaminated objects. SIGNS AND SYMPTOMS   Cough.   Fever.   Coughing up mucus.   Body aches.   Chest congestion.   Chills.   Shortness of breath.   Sore throat.  DIAGNOSIS  Acute bronchitis is usually diagnosed through a physical exam. Your health care provider will also ask you questions about your medical history. Tests, such as chest X-rays, are sometimes done to rule out other conditions.  TREATMENT  Acute bronchitis usually goes away in a couple weeks. Oftentimes, no medical treatment is necessary. Medicines are sometimes given for relief of fever or cough. Antibiotic medicines are usually not needed but may be prescribed in certain situations. In some cases, an inhaler may be recommended to help reduce shortness of breath and control the cough. A cool mist vaporizer may also be used to help thin bronchial secretions and make it easier to clear the chest.  HOME CARE INSTRUCTIONS  Get plenty of rest.   Drink enough fluids to keep your urine clear or pale yellow (unless you have a medical condition that requires fluid restriction). Increasing fluids may help thin your respiratory secretions (sputum) and reduce chest congestion, and it will prevent dehydration.   Take medicines only as  directed by your health care provider.  If you were prescribed an antibiotic medicine, finish it all even if you start to feel better.  Avoid smoking and secondhand smoke. Exposure to cigarette smoke or irritating chemicals will make bronchitis worse. If you are a smoker, consider using nicotine gum or skin patches to help control withdrawal symptoms. Quitting smoking will help your lungs heal faster.   Reduce the chances of another bout of acute bronchitis by washing your hands frequently, avoiding people with cold symptoms, and trying not to touch your hands to your mouth, nose, or eyes.   Keep all follow-up visits as directed by your health care provider.  SEEK MEDICAL CARE IF: Your symptoms do not improve after 1 week of treatment.  SEEK IMMEDIATE MEDICAL CARE IF:  You develop an increased fever or chills.   You have chest pain.   You have severe shortness of breath.  You have bloody sputum.   You develop dehydration.  You faint or repeatedly feel like you are going to pass out.  You develop repeated vomiting.  You develop a severe headache. MAKE SURE YOU:   Understand these instructions.  Will watch your condition.  Will get help right away if you are not doing well or get worse. Document Released: 09/29/2004 Document Revised: 01/06/2014 Document Reviewed: 02/12/2013 [facility] Patient Information 2015 [place], [place]. This information is not intended to replace advice given to you by your health care provider. Make sure you discuss any questions you have with your health care provider.

## 2015-06-08 NOTE — Progress Notes (Signed)
    MRN: [id number] DOB: [birth year]  Subjective:   Tracy Cervantes is a 29 y.o. female presenting for chief complaint of Cough  Reports 1.5 month history worsening intermittently productive cough. Has tried Robitussin and honey without any relief. Admits history of seasonal allergies but does not take anything for this. Denies fever, chest pain, shob, wheezing, chest tightness, n/v, abdominal pain, sinus pain, ear pain, ear drainage, itchy or red eyes, tooth pain. Denies smoking cigarettes. Denies history of asthma. Denies any other aggravating or relieving factors, no other questions or concerns.  Tracy Cervantes has a current medication list which includes the following prescription(s): NONFORMULARY OR COMPOUNDED ITEM, and the following Facility-Administered Medications: levonorgestrel. Also has No Known Allergies.  Tracy Cervantes  has a past medical history of Allergy and Gonorrhea (02/2014). Also  has past surgical history that includes Appendectomy; Mirena; and Wisdom tooth extraction.  Objective:   Vitals: BP 143/85 mmHg  Pulse 76  Temp(Src) 99 F (37.2 C) (Oral)  Resp 16  Ht 5' 2.5" (1.588 m)  Wt 204 lb 3.2 oz (92.625 kg)  BMI 36.73 kg/m2  Physical Exam  Constitutional: She is oriented to person, place, and time. She appears well-developed and well-nourished.  HENT:  TM's intact bilaterally, no effusions or erythema. Nares patent, nasal turbinates pink and moist, nasal passages patent. No sinus tenderness. Oropharynx clear, mucous membranes moist, dentition in good repair.  Eyes: Conjunctivae are normal. Right eye exhibits no discharge. Left eye exhibits no discharge. No scleral icterus.  Cardiovascular: Normal rate, regular rhythm and intact distal pulses.   Pulmonary/Chest: No stridor. No respiratory distress. She has wheezes (rhonchirous lung sounds mid-lower bases bilaterally, R>L). She has no rales.  Musculoskeletal: She exhibits no edema.  Lymphadenopathy:    She has no cervical  adenopathy.  Neurological: She is alert and oriented to person, place, and time.  Skin: Skin is warm and dry. No rash noted. No erythema. No pallor.   Assessment and Plan :   1. Acute bronchitis, unspecified organism 2. Cough - Will cover for infectious process with Azithromycin. - Advised supportive care with Hycodan, Tessalon. - RTC in 1 week if no improvement or sooner if worsening.  Wallis Bamberg, PA-C [facility] [phone] 06/08/2015 4:07 PM

## 2015-06-17 ENCOUNTER — Ambulatory Visit: Payer: 59 | Admitting: Family Medicine

## 2015-06-23 ENCOUNTER — Other Ambulatory Visit: Payer: Self-pay | Admitting: Urgent Care

## 2015-06-30 ENCOUNTER — Ambulatory Visit (INDEPENDENT_AMBULATORY_CARE_PROVIDER_SITE_OTHER): Payer: 59 | Admitting: Family Medicine

## 2015-06-30 ENCOUNTER — Encounter: Payer: Self-pay | Admitting: Family Medicine

## 2015-06-30 VITALS — BP 139/92 | HR 99 | Temp 99.0°F | Resp 16 | Wt 205.0 lb

## 2015-06-30 DIAGNOSIS — J209 Acute bronchitis, unspecified: Secondary | ICD-10-CM | POA: Diagnosis not present

## 2015-06-30 DIAGNOSIS — E663 Overweight: Secondary | ICD-10-CM

## 2015-06-30 NOTE — Progress Notes (Signed)
   Subjective:    Patient ID: Tracy Cervantes, female    DOB: [birth year], 29 y.o.   MRN: [id number]  HPI This is a pleasant 29 yo female who presents today for follow up of recent upper respiratory infection. She finished her antibiotic and feels much better. She still has occasional cough. No wheeze or SOB. Cough does not interfere with sleep.  She is concerned about recent weight gain. She eats asian food- white rice, meat, broth based soups, vegetables, some soda and juice. Often skips meals.   She enjoys her work but is bored and has been considering going back to school for accounting. She didn't make very good grades in college and is concerned that she won't be able to get into graduate school.   Past Medical History  Diagnosis Date  . Allergy   . Gonorrhea 02/2014   Past Surgical History  Procedure Laterality Date  . Appendectomy    . Mirena      Inserted 03-13-13  . Wisdom tooth extraction     Family History  Problem Relation Age of Onset  . Stroke Mother 3557   Social History  Substance Use Topics  . Smoking status: Never Smoker   . Smokeless tobacco: None  . Alcohol Use: 0.0 oz/week    0 Standard drinks or equivalent per week     Comment: occassional    Review of Systems No fever, no chills, no SOB, no wheeze, no rhinorrhea, no sore throat    Objective:   Physical Exam Physical Exam  Constitutional: Oriented to person, place, and time. She appears well-developed and well-nourished.  HENT:  Head: Normocephalic and atraumatic.  Eyes: Conjunctivae are normal.  Neck: Normal range of motion. Neck supple.  Cardiovascular: Normal rate, regular rhythm and normal heart sounds.   Pulmonary/Chest: Effort normal and breath sounds normal.  Musculoskeletal: Normal range of motion.  Neurological: Alert and oriented to person, place, and time.  Skin: Skin is warm and dry.  Psychiatric: Normal mood and affect. Behavior is normal. Judgment and thought content normal.  Vitals  reviewed.  BP 139/92 mmHg  Pulse 99  Temp(Src) 99 F (37.2 C) (Oral)  Resp 16  Wt 205 lb (92.987 kg) Wt Readings from Last 3 Encounters:  06/30/15 205 lb (92.987 kg)  06/08/15 204 lb 3.2 oz (92.625 kg)  02/03/15 191 lb (86.637 kg)      Assessment & Plan:  1. Overweight (BMI 25.0-29.9) - Ambulatory referral to diabetic education  2. Acute bronchitis, unspecified organism - resolved with some lingering cough, patient otherwise feels better - follow up PRN  Olean Reeeborah Zacherie Honeyman, FNP-BC  [facility]  07/03/2015 4:45 PM

## 2015-11-26 ENCOUNTER — Encounter: Payer: Self-pay | Admitting: Family Medicine

## 2015-11-26 ENCOUNTER — Ambulatory Visit (INDEPENDENT_AMBULATORY_CARE_PROVIDER_SITE_OTHER): Payer: 59 | Admitting: Family Medicine

## 2015-11-26 VITALS — BP 136/86 | HR 88 | Temp 98.5°F | Resp 16 | Wt 207.8 lb

## 2015-11-26 DIAGNOSIS — Z124 Encounter for screening for malignant neoplasm of cervix: Secondary | ICD-10-CM

## 2015-11-26 DIAGNOSIS — A499 Bacterial infection, unspecified: Secondary | ICD-10-CM

## 2015-11-26 DIAGNOSIS — Z113 Encounter for screening for infections with a predominantly sexual mode of transmission: Secondary | ICD-10-CM | POA: Diagnosis not present

## 2015-11-26 DIAGNOSIS — N760 Acute vaginitis: Secondary | ICD-10-CM

## 2015-11-26 DIAGNOSIS — B9689 Other specified bacterial agents as the cause of diseases classified elsewhere: Secondary | ICD-10-CM

## 2015-11-26 DIAGNOSIS — R87810 Cervical high risk human papillomavirus (HPV) DNA test positive: Secondary | ICD-10-CM | POA: Diagnosis not present

## 2015-11-26 DIAGNOSIS — N898 Other specified noninflammatory disorders of vagina: Secondary | ICD-10-CM | POA: Diagnosis not present

## 2015-11-26 LAB — POCT WET + KOH PREP
TRICH BY WET PREP: ABSENT
YEAST BY WET PREP: ABSENT
Yeast by KOH: ABSENT

## 2015-11-26 MED ORDER — AMBULATORY NON FORMULARY MEDICATION: Status: AC

## 2015-11-26 MED ORDER — METRONIDAZOLE 0.75 % VA GEL: 1.0000 | Freq: Every day | VAGINAL | Status: AC

## 2015-11-26 MED ORDER — CLOTRIMAZOLE 2 % VA CREA: TOPICAL_CREAM | VAGINAL | Status: AC

## 2015-11-26 MED FILL — metroNIDAZOLE 0.75 % GEL: 0.75 | 5 days supply | Qty: 70 | Fill #0

## 2015-11-26 NOTE — Progress Notes (Signed)
Subjective:    Patient ID: Tracy Cervantes, female    DOB: 19-Aug-1986, 30 y.o.   MRN: [id number] Chief Complaint  Patient presents with  . ? bacteria vaginosis  . ? pap    HPI  Gets recurrent BV after she has sex. She has used oral flagyl which was really upsetting to her stomach but did fine on vag supp gel.   BOric acid vag supp pills working well but she ran out.   Is having recurring odor, no discharge, occ itching, no pain.  Sxs triggered again after intercourse 5d course  [facility] She is [place] beginning [place] to be with her boyfriend  2009 ascus with positive HR HPV with negative colposcopy and biopsy and normal Paps after. Last pap normal was 10/2012. History of positive GC 2008, 2015, Chlamydia 2012 all had negative test of cure after. Now in monogamous partner. Not sure if she had gardasil.  Past Medical History  Diagnosis Date  . Allergy   . Gonorrhea 02/2014   Past Surgical History  Procedure Laterality Date  . Appendectomy    . Mirena      Inserted 03-13-13  . Wisdom tooth extraction     No current outpatient prescriptions on file prior to visit.   Current Facility-Administered Medications on File Prior to Visit  Medication Dose Route Frequency Provider Last Rate Last Dose  . levonorgestrel (MIRENA) 20 MCG/24HR IUD   Intrauterine Once Dara Lordsimothy P Fontaine, MD       No Known Allergies Family History  Problem Relation Age of Onset  . Stroke Mother 5457   Social History   Social History  . Marital Status: Single    Spouse Name: N/A  . Number of Children: N/A  . Years of Education: N/A   Social History Main Topics  . Smoking status: Never Smoker   . Smokeless tobacco: None  . Alcohol Use: 0.0 oz/week    0 Standard drinks or equivalent per week     Comment: occassional  . Drug Use: No  . Sexual Activity: Yes    Birth Control/ Protection: IUD     Comment: Mirena inserted 03-13-13, intercourse age  89, sexual partners more than 5   Other Topics Concern  . None   Social History Narrative    Review of Systems  Constitutional: Negative for fever, chills, diaphoresis, activity change, appetite change, fatigue and unexpected weight change.  Gastrointestinal: Negative for abdominal pain, diarrhea, constipation, blood in stool, anal bleeding and rectal pain.  Genitourinary: Negative for dysuria, urgency, frequency, hematuria, decreased urine volume, vaginal bleeding, vaginal discharge, difficulty urinating, genital sores, vaginal pain, menstrual problem, pelvic pain and dyspareunia.  Musculoskeletal: Negative for gait problem.  Skin: Negative for rash.  Hematological: Negative for adenopathy.  Psychiatric/Behavioral: The patient is not nervous/anxious.        Objective:  BP 136/86 mmHg  Pulse 88  Temp(Src) 98.5 F (36.9 C) (Oral)  Resp 16  Wt 207 lb 12.8 oz (94.257 kg)  Physical Exam  Constitutional: She is oriented to person, place, and time. She appears well-developed and well-nourished. No distress.  HENT:  Head: Normocephalic and atraumatic.  Cardiovascular: Normal rate, regular rhythm, normal heart sounds and intact distal pulses.   Pulmonary/Chest: Effort normal and breath sounds normal.  Abdominal: Soft. Bowel sounds are normal. She exhibits no distension. There is no tenderness. There is no rebound and no guarding.  Genitourinary: Uterus normal.  Pelvic exam was performed with patient supine. There is no rash, tenderness or lesion on the right labia. There is no rash, tenderness or lesion on the left labia. Cervix exhibits discharge and friability. Cervix exhibits no motion tenderness. Right adnexum displays no mass, no tenderness and no fullness. Left adnexum displays no mass, no tenderness and no fullness. No erythema or tenderness in the vagina. Vaginal discharge found.  Lymphadenopathy:       Right: No inguinal adenopathy present.       Left: No inguinal adenopathy  present.  Neurological: She is alert and oriented to person, place, and time.  Skin: Skin is warm and dry. She is not diaphoretic.  Psychiatric: She has a normal mood and affect. Her behavior is normal.    iud string visualized about 5 mm in length Cervix friable      Assessment & Plan:  Boric acid supp called in to [facility] 1. Vaginal irritation   2. Bacterial vaginitis   3. Cervical high risk HPV (human papillomavirus) test positive   4. Screening for STD (sexually transmitted disease)   5. Screening for cervical cancer     Orders Placed This Encounter  Procedures  . RPR  . HIV antibody  . Hepatitis C Antibody  . POCT Wet + KOH Prep    Meds ordered this encounter  Medications  . AMBULATORY NON FORMULARY MEDICATION    Sig: Boric Acid Suppository 600 mg Insert 1 PV twice weekly to maintain vaginal flora    Dispense:  30 suppository    Refill:  1  . clotrimazole (GYNE-LOTRIMIN 3) 2 % vaginal cream    Sig: Apply 1 in amount to perineum and labia twice a day for 2 weeks    Dispense:  21 g    Refill:  1  . metroNIDAZOLE (METROGEL VAGINAL) 0.75 % vaginal gel    Sig: Place 1 Applicatorful vaginally at bedtime. x5d    Dispense:  70 g    Refill:  0    I personally performed the services described in this documentation, which was scribed in my presence. The recorded information has been reviewed and considered, and addended by me as needed.  Norberto Sorenson, MD MPH

## 2015-11-26 NOTE — Patient Instructions (Addendum)
   IF you received an x-ray today, you will receive an invoice from [facility]. Please contact [facility] at [phone] with questions or concerns regarding your invoice.   IF you received labwork today, you will receive an invoice from [facility]. Please contact [facility] at [phone] with questions or concerns regarding your invoice.   Our billing staff will not be able to assist you with questions regarding bills from these companies.  You will be contacted with the lab results as soon as they are available. The fastest way to get your results is to activate your My Chart account. Instructions are located on the last page of this paperwork. If you have not heard from us regarding the results in 2 weeks, please contact this office.     Bacterial Vaginosis Bacterial vaginosis is a vaginal infection that occurs when the normal balance of bacteria in the vagina is disrupted. It results from an overgrowth of certain bacteria. This is the most common vaginal infection in women of childbearing age. Treatment is important to prevent complications, especially in pregnant women, as it can cause a premature delivery. CAUSES  Bacterial vaginosis is caused by an increase in harmful bacteria that are normally present in smaller amounts in the vagina. Several different kinds of bacteria can cause bacterial vaginosis. However, the reason that the condition develops is not fully understood. RISK FACTORS Certain activities or behaviors can put you at an increased risk of developing bacterial vaginosis, including:  Having a new sex partner or multiple sex partners.  Douching.  Using an intrauterine device (IUD) for contraception. Women do not get bacterial vaginosis from toilet seats, bedding, swimming pools, or contact with objects around them. SIGNS AND SYMPTOMS  Some women with bacterial vaginosis have no signs or symptoms. Common symptoms  include:  Grey vaginal discharge.  A fishlike odor with discharge, especially after sexual intercourse.  Itching or burning of the vagina and vulva.  Burning or pain with urination. DIAGNOSIS  Your health care provider will take a medical history and examine the vagina for signs of bacterial vaginosis. A sample of vaginal fluid may be taken. Your health care provider will look at this sample under a microscope to check for bacteria and abnormal cells. A vaginal pH test may also be done.  TREATMENT  Bacterial vaginosis may be treated with antibiotic medicines. These may be given in the form of a pill or a vaginal cream. A second round of antibiotics may be prescribed if the condition comes back after treatment. Because bacterial vaginosis increases your risk for sexually transmitted diseases, getting treated can help reduce your risk for chlamydia, gonorrhea, HIV, and herpes. HOME CARE INSTRUCTIONS   Only take over-the-counter or prescription medicines as directed by your health care provider.  If antibiotic medicine was prescribed, take it as directed. Make sure you finish it even if you start to feel better.  Tell all sexual partners that you have a vaginal infection. They should see their health care provider and be treated if they have problems, such as a mild rash or itching.  During treatment, it is important that you follow these instructions:  Avoid sexual activity or use condoms correctly.  Do not douche.  Avoid alcohol as directed by your health care provider.  Avoid breastfeeding as directed by your health care provider. SEEK MEDICAL CARE IF:   Your symptoms are not improving after 3 days of treatment.  You have increased discharge or pain.  You have   a fever. MAKE SURE YOU:   Understand these instructions.  Will watch your condition.  Will get help right away if you are not doing well or get worse. FOR MORE INFORMATION  Centers for Disease Control and  Prevention, Division of STD Prevention: www.cdc.gov/std American Sexual Health Association (ASHA): www.ashastd.org    This information is not intended to replace advice given to you by your health care provider. Make sure you discuss any questions you have with your health care provider.   Document Released: 08/22/2005 Document Revised: 09/12/2014 Document Reviewed: 04/03/2013 [employer] Interactive Patient Education 2016 [employer]   

## 2015-11-30 LAB — PAP IG, CT-NG NAA, HPV HIGH-RISK
CHLAMYDIA PROBE AMP: NOT DETECTED
GC PROBE AMP: NOT DETECTED
HPV DNA High Risk: NOT DETECTED

## 2016-02-04 ENCOUNTER — Encounter: Payer: 59 | Admitting: Women's Health
